# Patient Record
Sex: Female | Born: 1940 | Race: White | Hispanic: No | Smoking: Former smoker
Health system: Southern US, Community
[De-identification: ages and names within clinical notes are randomized; demographics above are authoritative.]

## PROBLEM LIST (undated history)

## (undated) DIAGNOSIS — Z85828 Personal history of other malignant neoplasm of skin: Secondary | ICD-10-CM

## (undated) DIAGNOSIS — N183 Chronic kidney disease, stage 3 unspecified: Secondary | ICD-10-CM

## (undated) DIAGNOSIS — M1990 Unspecified osteoarthritis, unspecified site: Secondary | ICD-10-CM

## (undated) DIAGNOSIS — R7303 Prediabetes: Secondary | ICD-10-CM

## (undated) DIAGNOSIS — T4145XA Adverse effect of unspecified anesthetic, initial encounter: Secondary | ICD-10-CM

## (undated) DIAGNOSIS — J45909 Unspecified asthma, uncomplicated: Secondary | ICD-10-CM

## (undated) DIAGNOSIS — K219 Gastro-esophageal reflux disease without esophagitis: Secondary | ICD-10-CM

## (undated) DIAGNOSIS — I77810 Thoracic aortic ectasia: Secondary | ICD-10-CM

## (undated) DIAGNOSIS — Z8719 Personal history of other diseases of the digestive system: Secondary | ICD-10-CM

## (undated) DIAGNOSIS — E7800 Pure hypercholesterolemia, unspecified: Secondary | ICD-10-CM

## (undated) DIAGNOSIS — C801 Malignant (primary) neoplasm, unspecified: Secondary | ICD-10-CM

## (undated) DIAGNOSIS — R51 Headache: Secondary | ICD-10-CM

## (undated) DIAGNOSIS — I10 Essential (primary) hypertension: Secondary | ICD-10-CM

## (undated) DIAGNOSIS — T8859XA Other complications of anesthesia, initial encounter: Secondary | ICD-10-CM

## (undated) DIAGNOSIS — M8580 Other specified disorders of bone density and structure, unspecified site: Secondary | ICD-10-CM

## (undated) HISTORY — DX: Personal history of other malignant neoplasm of skin: Z85.828

## (undated) HISTORY — DX: Other specified disorders of bone density and structure, unspecified site: M85.80

## (undated) HISTORY — PX: COLONOSCOPY: SHX174

## (undated) HISTORY — PX: EYE SURGERY: SHX253

## (undated) HISTORY — PX: APPENDECTOMY: SHX54

## (undated) HISTORY — PX: CHOLECYSTECTOMY: SHX55

## (undated) HISTORY — DX: Chronic kidney disease, stage 3 unspecified: N18.30

## (undated) HISTORY — DX: Gastro-esophageal reflux disease without esophagitis: K21.9

## (undated) HISTORY — DX: Prediabetes: R73.03

## (undated) HISTORY — DX: Thoracic aortic ectasia: I77.810

## (undated) HISTORY — PX: ABDOMINAL HYSTERECTOMY: SHX81

---

## 1999-02-16 ENCOUNTER — Encounter: Payer: Self-pay | Admitting: Dermatology

## 1999-02-16 ENCOUNTER — Encounter: Admission: RE | Admit: 1999-02-16 | Discharge: 1999-02-16 | Payer: Self-pay | Admitting: Dermatology

## 1999-11-08 ENCOUNTER — Encounter: Payer: Self-pay | Admitting: Family Medicine

## 1999-11-08 ENCOUNTER — Encounter: Admission: RE | Admit: 1999-11-08 | Discharge: 1999-11-08 | Payer: Self-pay | Admitting: Family Medicine

## 2000-11-08 ENCOUNTER — Encounter: Payer: Self-pay | Admitting: Family Medicine

## 2000-11-08 ENCOUNTER — Encounter: Admission: RE | Admit: 2000-11-08 | Discharge: 2000-11-08 | Payer: Self-pay | Admitting: Family Medicine

## 2000-11-24 ENCOUNTER — Inpatient Hospital Stay (HOSPITAL_COMMUNITY): Admission: EM | Admit: 2000-11-24 | Discharge: 2000-11-26 | Payer: Self-pay | Admitting: Emergency Medicine

## 2000-11-24 ENCOUNTER — Encounter: Payer: Self-pay | Admitting: Emergency Medicine

## 2000-11-26 ENCOUNTER — Encounter: Payer: Self-pay | Admitting: Internal Medicine

## 2000-12-11 ENCOUNTER — Encounter: Payer: Self-pay | Admitting: Family Medicine

## 2000-12-11 ENCOUNTER — Encounter: Admission: RE | Admit: 2000-12-11 | Discharge: 2000-12-11 | Payer: Self-pay | Admitting: Family Medicine

## 2000-12-17 ENCOUNTER — Encounter: Admission: RE | Admit: 2000-12-17 | Discharge: 2000-12-17 | Payer: Self-pay | Admitting: Family Medicine

## 2000-12-17 ENCOUNTER — Encounter: Payer: Self-pay | Admitting: Family Medicine

## 2000-12-31 ENCOUNTER — Encounter: Admission: RE | Admit: 2000-12-31 | Discharge: 2000-12-31 | Payer: Self-pay | Admitting: Gastroenterology

## 2000-12-31 ENCOUNTER — Encounter: Payer: Self-pay | Admitting: Gastroenterology

## 2001-01-07 ENCOUNTER — Encounter: Payer: Self-pay | Admitting: Gastroenterology

## 2001-01-07 ENCOUNTER — Encounter: Admission: RE | Admit: 2001-01-07 | Discharge: 2001-01-07 | Payer: Self-pay | Admitting: Gastroenterology

## 2001-01-24 ENCOUNTER — Encounter (INDEPENDENT_AMBULATORY_CARE_PROVIDER_SITE_OTHER): Payer: Self-pay | Admitting: Specialist

## 2001-01-24 ENCOUNTER — Encounter: Payer: Self-pay | Admitting: General Surgery

## 2001-01-25 ENCOUNTER — Inpatient Hospital Stay (HOSPITAL_COMMUNITY): Admission: RE | Admit: 2001-01-25 | Discharge: 2001-01-26 | Payer: Self-pay | Admitting: General Surgery

## 2001-11-14 ENCOUNTER — Encounter: Admission: RE | Admit: 2001-11-14 | Discharge: 2001-11-14 | Payer: Self-pay | Admitting: Family Medicine

## 2001-11-14 ENCOUNTER — Encounter: Payer: Self-pay | Admitting: Family Medicine

## 2002-11-19 ENCOUNTER — Encounter: Payer: Self-pay | Admitting: Family Medicine

## 2002-11-19 ENCOUNTER — Encounter: Admission: RE | Admit: 2002-11-19 | Discharge: 2002-11-19 | Payer: Self-pay | Admitting: Family Medicine

## 2003-11-24 ENCOUNTER — Encounter: Admission: RE | Admit: 2003-11-24 | Discharge: 2003-11-24 | Payer: Self-pay | Admitting: Obstetrics and Gynecology

## 2004-12-13 ENCOUNTER — Encounter: Admission: RE | Admit: 2004-12-13 | Discharge: 2004-12-13 | Payer: Self-pay | Admitting: Family Medicine

## 2005-12-14 ENCOUNTER — Encounter: Admission: RE | Admit: 2005-12-14 | Discharge: 2005-12-14 | Payer: Self-pay | Admitting: Family Medicine

## 2006-12-17 ENCOUNTER — Encounter: Admission: RE | Admit: 2006-12-17 | Discharge: 2006-12-17 | Payer: Self-pay | Admitting: Family Medicine

## 2007-12-18 ENCOUNTER — Encounter: Admission: RE | Admit: 2007-12-18 | Discharge: 2007-12-18 | Payer: Self-pay | Admitting: Family Medicine

## 2008-12-18 ENCOUNTER — Encounter: Admission: RE | Admit: 2008-12-18 | Discharge: 2008-12-18 | Payer: Self-pay | Admitting: Family Medicine

## 2009-12-21 ENCOUNTER — Encounter: Admission: RE | Admit: 2009-12-21 | Discharge: 2009-12-21 | Payer: Self-pay | Admitting: Family Medicine

## 2010-09-09 NOTE — Op Note (Signed)
[facility]  Patient:    Connie Weber, Connie Weber Visit Number: [id number] MRN: [id number]          Service Type: SUR Location: [id number] Attending Physician:  Arlis Porta Proc. Date: 01/24/01 Admit Date:  01/24/2001   CC:         Blair Heys, M.D.  James L. Randa Evens, M.D.   Operative Report  PREOPERATIVE DIAGNOSIS:  Symptomatic cholecystitis.  POSTOPERATIVE DIAGNOSIS:  Chronic calculus cholecystitis.  PROCEDURE:  Laparoscopic cholecystectomy with intraoperative cholangiogram.  SURGEON:  Adolph Pollack, M.D.  ASSISTANT:  Sheppard Plumber. Earlene Plater, M.D.  ANESTHESIA:  General.  INDICATIONS:  Ms. Robart is a 70 year old female, who had an episode of severe epigastric and substernal pressure type pain that radiated around to the back. She was nauseated and felt sweaty.  She went to the hospital, and she was ruled out for myocardial infarction.  She had a gallbladder ultrasound which was read as normal.  She saw Dr. Randa Evens, and a CT scan suggested that she had gallstones.  An oral cholecystogram was performed to confirm the fact that she had multiple gallstones.  Her history is consistent with biliary colic, and now she is brought to the operating room for elective cholecystectomy.  The procedure and risks were explained to her preoperatively.  TECHNIQUE:  She was placed supine on the operating table, and a general anesthetic was administered.  Her abdomen was sterilely prepped and draped. Local anesthetic was infiltrated in the subumbilical region, and a small longitudinal subumbilical ______  was made incising the skin and then dividing the subcutaneous tissue sharply.  The fascia was identified, and a 1-1.5 cm incision made in the midline fascia.  A pursestring suture of 0 Vicryl was placed around the fascial edges.  The peritoneal cavity was then entered bluntly and under direct vision.  A Hasson trocar was introduced into the peritoneal  cavity, and a pneumoperitoneum was created by insufflation of C02 gas.  Next, the laparoscope was introduced.  The patient was placed in the reverse Trendelenburg position with her right side tipped upward.  The liver appeared normal on the surface as did the stomach.  The omentum was draped down over the lower abdominal viscera, so I did not get to visualize it.  Under direct vision, an 11 mm trocar was placed through an epigastric incision and two 5 mm trocars placed through right abdominal incisions.  The fundus of the gallbladder was grasped and omental adhesions and adhesions to the duodenum and infundibulum were taken down sharply and bluntly.  The infundibulum was then grasped and then using blunt dissection, with no cautery, the infundibulum was completely mobilized, creating a window through which we identified the cystic duct and the cystic artery.  A clip was placed through the cystic duct gallbladder junction, and a small incision was made in the cystic duct.  A cholangiocatheter was passed through the anterior abdominal wall, into the cystic duct, and a cholangiogram was performed.  Under real-time fluoroscopy, dilute contrast material was injected into the cystic duct.  It promptly went into the common hepatic, and right and left hepatic ducts were seen.  It then travelled down the common bile duct and splashed into the duodenum.  There were no obvious obstructing lesions.  The final report is pending the radiologists interpretation.  I then removed the cholangiocatheter and milked the cystic duct and noticed three small stone fragments came out of the cystic duct.  No further fragments came out with  milking the cystic duct.  I clipped the cystic duct three times on the staying-in side and divided it.  The cystic artery was then clipped and divided.  The gallbladder was dissected free from the liver bed with the cautery.  A small perforation was placed in the gallbladder  with leakage of some bilious fluid but no stones.  Once the gallbladder was removed, it was placed in an Endopouch bag.  I then copiously irrigated the perihepatic area and examined the gallbladder fossa, and it was hemostatic.  I continued to irrigate until the fluid returning became clear.  I then removed the gallbladder through the subumbilical port and under direct vision, closed the subumbilical fascial defect by tightening up and tying down the pursestring suture. pursestring suture.  The rest of the ports were removed and pneumoperitoneum was released.  Her skin incisions were then closed with 4-0 Monocryl subcuticular stitches followed by Steri-Strips and sterile dressings.  She tolerated the procedure well without any apparent complications and was taken to the recovery room in satisfactory condition. Attending Physician:  Arlis Porta DD:  01/24/01 TD:  01/24/01 Job: [id number]

## 2010-09-09 NOTE — H&P (Signed)
[place]  Patient:    Connie Weber, Connie Weber                         MRN: [id number] Adm. Date:  11/24/00 Attending:  Desma Maxim, M.D.                         History and Physical  CHIEF COMPLAINT:  Chest pain.  HISTORY OF PRESENT ILLNESS:  The patient is a 70 year old female patient of Dr. Manus Gunning with known diagnoses of hypertension, allergy, and hiatal hernia who presents with a history of waking up this morning around 3 a.m. with a tightness across her chest.  It felt like she needed to take a deep breath and she would feel fine or stretch out her chest.  She thought it might be her hernia.  She took a Tums at that point.  Symptoms increased over the ensuing hour.  She took a second Tums.  No relieve.  She felt hot, sweatiness across the body, but no palpitation or shortness of breath other than again felt like she needed to take a deep breath.  She did have some nausea but no vomiting or diarrhea.  She has had no dyspeptic symptoms.  Symptoms persisted and at 5:30 took two aspirin, which she says helped her symptoms; however, because of the intensity, how bad she felt, and nervousness came to the emergency room at about 6 oclock, at which time she was pain free and has remained such since that time.  She is known to have a hiatal hernia and last night did have some sauerkraut and some Vienna sausage, which she normally tries to avoid, so I do not know if that had anything to play with this or not.  She had been fine otherwise with just mild flare of her allergies but no fever, chills, cough, cold, or shortness of breath.  No urinary symptoms.  In the emergency room, again she was asymptomatic.  Labs were unremarkable.  Enzymes negative.  EKG negative but, given history and risk factors of hypertension and hyperlipidemia, is admitted to rule out.  PAST MEDICAL HISTORY:  Hypertension, allergy, and hiatal hernia.  MEDICATIONS: 1. Premarin  0.625. 2. Ventolin p.r.n. 3. Aspirin q.d. 4. Norvasc 5. 5. Diovan 80. 6. Allegra 60 b.i.d. p.r.n.  FAMILY HISTORY:  Heart disease in her maternal grandmother when she was older. Hypertension fairly prominent on both sides of the family.  PAST SURGICAL HISTORY:  Hysterectomy, noncancer.  Appendectomy and repair of right rotator cuff.  She had a recent mammogram in July, just a month ago, and it was normal.  She did have labs in May.  Cholesterol 273, LDL 191 and was supposed to work on diet but I doubt that she will be able to attain the 30+ percent reduction that she needs.  REVIEW OF SYSTEMS:  Otherwise is negative except as related to above.  PHYSICAL EXAMINATION:  VITAL SIGNS:  135/70 and regular, pulse 74 and regular, respirations 16 and unlabored, afebrile.  EKG unremarkable.  HEENT:  Wears glasses.  TMs and canals clear.  PERRL.  EOMs full.  Disks flat. Arteriolar narrowing.  Nasal cavity/oral cavity clear.  NECK:  Supple.  No adenopathy, JVD, bruits, or enlarged thyroid.  CHEST:  Clear, nontender.  RR without MGCR.  ABDOMEN:  Normal BS.  Soft, nontender.  No masses or organomegaly.  Rectal was not done, as pelvic was  not done.  EXTREMITIES:  No acute joint abnormality.  No edema.  Pulses intact.  NEUROLOGIC:  Intact.  LABORATORY DATA:  EKG normal.  No acute change.  No abnormalities other than slightly elevated white count at 11.9 with just a slight shift.  ASSESSMENT: 1. Chest pain, rule out cardiac, possible gastrointestinal. 2. High blood pressure. 3. Hyperlipidemia. 4. Allergy.  PLAN:  Serial enzymes and telemetry monitoring.  Institute hyperlipidemic therapy.  Probable outpatient ETT. DD:  11/24/00 TD:  11/25/00 Job: [id number]

## 2010-11-22 ENCOUNTER — Other Ambulatory Visit: Payer: Self-pay | Admitting: Family Medicine

## 2010-11-22 DIAGNOSIS — Z1231 Encounter for screening mammogram for malignant neoplasm of breast: Secondary | ICD-10-CM

## 2010-12-29 ENCOUNTER — Ambulatory Visit
Admission: RE | Admit: 2010-12-29 | Discharge: 2010-12-29 | Disposition: A | Discharge status: Home / Self Care | Payer: Medicare Other | Source: Ambulatory Visit | Attending: Family Medicine | Admitting: Family Medicine

## 2010-12-29 DIAGNOSIS — Z1231 Encounter for screening mammogram for malignant neoplasm of breast: Secondary | ICD-10-CM

## 2011-01-02 ENCOUNTER — Other Ambulatory Visit: Payer: Self-pay | Admitting: Family Medicine

## 2011-01-02 DIAGNOSIS — R928 Other abnormal and inconclusive findings on diagnostic imaging of breast: Secondary | ICD-10-CM

## 2011-01-04 ENCOUNTER — Ambulatory Visit
Admission: RE | Admit: 2011-01-04 | Discharge: 2011-01-04 | Disposition: A | Discharge status: Home / Self Care | Payer: Medicare Other | Source: Ambulatory Visit | Attending: Family Medicine | Admitting: Family Medicine

## 2011-01-04 DIAGNOSIS — R928 Other abnormal and inconclusive findings on diagnostic imaging of breast: Secondary | ICD-10-CM

## 2011-04-25 HISTORY — PX: OTHER SURGICAL HISTORY: SHX169

## 2011-04-27 DIAGNOSIS — M75120 Complete rotator cuff tear or rupture of unspecified shoulder, not specified as traumatic: Secondary | ICD-10-CM | POA: Diagnosis not present

## 2011-04-27 DIAGNOSIS — M25519 Pain in unspecified shoulder: Secondary | ICD-10-CM | POA: Diagnosis not present

## 2011-04-27 DIAGNOSIS — M19019 Primary osteoarthritis, unspecified shoulder: Secondary | ICD-10-CM | POA: Diagnosis not present

## 2011-05-02 DIAGNOSIS — M19019 Primary osteoarthritis, unspecified shoulder: Secondary | ICD-10-CM | POA: Diagnosis not present

## 2011-05-02 DIAGNOSIS — M25519 Pain in unspecified shoulder: Secondary | ICD-10-CM | POA: Diagnosis not present

## 2011-05-02 DIAGNOSIS — M75120 Complete rotator cuff tear or rupture of unspecified shoulder, not specified as traumatic: Secondary | ICD-10-CM | POA: Diagnosis not present

## 2011-05-04 DIAGNOSIS — M19019 Primary osteoarthritis, unspecified shoulder: Secondary | ICD-10-CM | POA: Diagnosis not present

## 2011-05-04 DIAGNOSIS — M25519 Pain in unspecified shoulder: Secondary | ICD-10-CM | POA: Diagnosis not present

## 2011-05-04 DIAGNOSIS — M75120 Complete rotator cuff tear or rupture of unspecified shoulder, not specified as traumatic: Secondary | ICD-10-CM | POA: Diagnosis not present

## 2011-05-09 DIAGNOSIS — M25519 Pain in unspecified shoulder: Secondary | ICD-10-CM | POA: Diagnosis not present

## 2011-05-09 DIAGNOSIS — M19019 Primary osteoarthritis, unspecified shoulder: Secondary | ICD-10-CM | POA: Diagnosis not present

## 2011-05-09 DIAGNOSIS — M75120 Complete rotator cuff tear or rupture of unspecified shoulder, not specified as traumatic: Secondary | ICD-10-CM | POA: Diagnosis not present

## 2011-05-11 DIAGNOSIS — M25519 Pain in unspecified shoulder: Secondary | ICD-10-CM | POA: Diagnosis not present

## 2011-05-11 DIAGNOSIS — M75120 Complete rotator cuff tear or rupture of unspecified shoulder, not specified as traumatic: Secondary | ICD-10-CM | POA: Diagnosis not present

## 2011-05-11 DIAGNOSIS — M19019 Primary osteoarthritis, unspecified shoulder: Secondary | ICD-10-CM | POA: Diagnosis not present

## 2011-05-16 DIAGNOSIS — M25519 Pain in unspecified shoulder: Secondary | ICD-10-CM | POA: Diagnosis not present

## 2011-05-16 DIAGNOSIS — M899 Disorder of bone, unspecified: Secondary | ICD-10-CM | POA: Diagnosis not present

## 2011-05-16 DIAGNOSIS — M949 Disorder of cartilage, unspecified: Secondary | ICD-10-CM | POA: Diagnosis not present

## 2011-05-16 DIAGNOSIS — M19019 Primary osteoarthritis, unspecified shoulder: Secondary | ICD-10-CM | POA: Diagnosis not present

## 2011-05-16 DIAGNOSIS — M75120 Complete rotator cuff tear or rupture of unspecified shoulder, not specified as traumatic: Secondary | ICD-10-CM | POA: Diagnosis not present

## 2011-05-18 DIAGNOSIS — M75120 Complete rotator cuff tear or rupture of unspecified shoulder, not specified as traumatic: Secondary | ICD-10-CM | POA: Diagnosis not present

## 2011-05-18 DIAGNOSIS — M25519 Pain in unspecified shoulder: Secondary | ICD-10-CM | POA: Diagnosis not present

## 2011-05-18 DIAGNOSIS — M19019 Primary osteoarthritis, unspecified shoulder: Secondary | ICD-10-CM | POA: Diagnosis not present

## 2011-05-23 DIAGNOSIS — M25519 Pain in unspecified shoulder: Secondary | ICD-10-CM | POA: Diagnosis not present

## 2011-05-23 DIAGNOSIS — M19019 Primary osteoarthritis, unspecified shoulder: Secondary | ICD-10-CM | POA: Diagnosis not present

## 2011-05-25 DIAGNOSIS — M75120 Complete rotator cuff tear or rupture of unspecified shoulder, not specified as traumatic: Secondary | ICD-10-CM | POA: Diagnosis not present

## 2011-05-25 DIAGNOSIS — M19019 Primary osteoarthritis, unspecified shoulder: Secondary | ICD-10-CM | POA: Diagnosis not present

## 2011-05-25 DIAGNOSIS — M25519 Pain in unspecified shoulder: Secondary | ICD-10-CM | POA: Diagnosis not present

## 2011-05-30 DIAGNOSIS — M25519 Pain in unspecified shoulder: Secondary | ICD-10-CM | POA: Diagnosis not present

## 2011-05-30 DIAGNOSIS — M75120 Complete rotator cuff tear or rupture of unspecified shoulder, not specified as traumatic: Secondary | ICD-10-CM | POA: Diagnosis not present

## 2011-05-30 DIAGNOSIS — M19019 Primary osteoarthritis, unspecified shoulder: Secondary | ICD-10-CM | POA: Diagnosis not present

## 2011-06-01 DIAGNOSIS — M25519 Pain in unspecified shoulder: Secondary | ICD-10-CM | POA: Diagnosis not present

## 2011-06-01 DIAGNOSIS — M75120 Complete rotator cuff tear or rupture of unspecified shoulder, not specified as traumatic: Secondary | ICD-10-CM | POA: Diagnosis not present

## 2011-06-01 DIAGNOSIS — M19019 Primary osteoarthritis, unspecified shoulder: Secondary | ICD-10-CM | POA: Diagnosis not present

## 2011-06-06 DIAGNOSIS — M75120 Complete rotator cuff tear or rupture of unspecified shoulder, not specified as traumatic: Secondary | ICD-10-CM | POA: Diagnosis not present

## 2011-06-06 DIAGNOSIS — M19019 Primary osteoarthritis, unspecified shoulder: Secondary | ICD-10-CM | POA: Diagnosis not present

## 2011-06-06 DIAGNOSIS — M25519 Pain in unspecified shoulder: Secondary | ICD-10-CM | POA: Diagnosis not present

## 2011-06-20 DIAGNOSIS — M19019 Primary osteoarthritis, unspecified shoulder: Secondary | ICD-10-CM | POA: Diagnosis not present

## 2011-06-20 DIAGNOSIS — M25519 Pain in unspecified shoulder: Secondary | ICD-10-CM | POA: Diagnosis not present

## 2011-06-20 DIAGNOSIS — M75120 Complete rotator cuff tear or rupture of unspecified shoulder, not specified as traumatic: Secondary | ICD-10-CM | POA: Diagnosis not present

## 2011-06-22 DIAGNOSIS — M19019 Primary osteoarthritis, unspecified shoulder: Secondary | ICD-10-CM | POA: Diagnosis not present

## 2011-10-24 DIAGNOSIS — M899 Disorder of bone, unspecified: Secondary | ICD-10-CM | POA: Diagnosis not present

## 2011-10-24 DIAGNOSIS — Z79899 Other long term (current) drug therapy: Secondary | ICD-10-CM | POA: Diagnosis not present

## 2011-10-24 DIAGNOSIS — E7800 Pure hypercholesterolemia, unspecified: Secondary | ICD-10-CM | POA: Diagnosis not present

## 2011-10-24 DIAGNOSIS — M949 Disorder of cartilage, unspecified: Secondary | ICD-10-CM | POA: Diagnosis not present

## 2011-10-24 DIAGNOSIS — M76899 Other specified enthesopathies of unspecified lower limb, excluding foot: Secondary | ICD-10-CM | POA: Diagnosis not present

## 2011-10-24 DIAGNOSIS — I10 Essential (primary) hypertension: Secondary | ICD-10-CM | POA: Diagnosis not present

## 2011-10-24 DIAGNOSIS — J45909 Unspecified asthma, uncomplicated: Secondary | ICD-10-CM | POA: Diagnosis not present

## 2011-10-24 DIAGNOSIS — K219 Gastro-esophageal reflux disease without esophagitis: Secondary | ICD-10-CM | POA: Diagnosis not present

## 2011-11-16 DIAGNOSIS — Z961 Presence of intraocular lens: Secondary | ICD-10-CM | POA: Diagnosis not present

## 2012-01-19 ENCOUNTER — Other Ambulatory Visit: Payer: Self-pay | Admitting: Family Medicine

## 2012-01-19 DIAGNOSIS — Z1231 Encounter for screening mammogram for malignant neoplasm of breast: Secondary | ICD-10-CM

## 2012-02-09 ENCOUNTER — Ambulatory Visit
Admission: RE | Admit: 2012-02-09 | Discharge: 2012-02-09 | Disposition: A | Discharge status: Home / Self Care | Payer: Medicare Other | Source: Ambulatory Visit | Attending: Family Medicine | Admitting: Family Medicine

## 2012-02-09 DIAGNOSIS — Z1231 Encounter for screening mammogram for malignant neoplasm of breast: Secondary | ICD-10-CM

## 2012-02-26 DIAGNOSIS — Z23 Encounter for immunization: Secondary | ICD-10-CM | POA: Diagnosis not present

## 2012-04-26 DIAGNOSIS — Z79899 Other long term (current) drug therapy: Secondary | ICD-10-CM | POA: Diagnosis not present

## 2012-04-26 DIAGNOSIS — K219 Gastro-esophageal reflux disease without esophagitis: Secondary | ICD-10-CM | POA: Diagnosis not present

## 2012-04-26 DIAGNOSIS — B351 Tinea unguium: Secondary | ICD-10-CM | POA: Diagnosis not present

## 2012-04-26 DIAGNOSIS — I10 Essential (primary) hypertension: Secondary | ICD-10-CM | POA: Diagnosis not present

## 2012-04-26 DIAGNOSIS — Z1331 Encounter for screening for depression: Secondary | ICD-10-CM | POA: Diagnosis not present

## 2012-04-26 DIAGNOSIS — M899 Disorder of bone, unspecified: Secondary | ICD-10-CM | POA: Diagnosis not present

## 2012-04-26 DIAGNOSIS — E7800 Pure hypercholesterolemia, unspecified: Secondary | ICD-10-CM | POA: Diagnosis not present

## 2012-09-18 DIAGNOSIS — R5381 Other malaise: Secondary | ICD-10-CM | POA: Diagnosis not present

## 2012-09-18 DIAGNOSIS — Z79899 Other long term (current) drug therapy: Secondary | ICD-10-CM | POA: Diagnosis not present

## 2012-09-18 DIAGNOSIS — M25559 Pain in unspecified hip: Secondary | ICD-10-CM | POA: Diagnosis not present

## 2012-09-18 DIAGNOSIS — E7800 Pure hypercholesterolemia, unspecified: Secondary | ICD-10-CM | POA: Diagnosis not present

## 2012-09-18 DIAGNOSIS — IMO0001 Reserved for inherently not codable concepts without codable children: Secondary | ICD-10-CM | POA: Diagnosis not present

## 2012-09-18 DIAGNOSIS — R5383 Other fatigue: Secondary | ICD-10-CM | POA: Diagnosis not present

## 2012-10-24 DIAGNOSIS — Z79899 Other long term (current) drug therapy: Secondary | ICD-10-CM | POA: Diagnosis not present

## 2012-10-24 DIAGNOSIS — M2550 Pain in unspecified joint: Secondary | ICD-10-CM | POA: Diagnosis not present

## 2012-10-24 DIAGNOSIS — E7800 Pure hypercholesterolemia, unspecified: Secondary | ICD-10-CM | POA: Diagnosis not present

## 2012-10-24 DIAGNOSIS — M25559 Pain in unspecified hip: Secondary | ICD-10-CM | POA: Diagnosis not present

## 2012-10-24 DIAGNOSIS — M949 Disorder of cartilage, unspecified: Secondary | ICD-10-CM | POA: Diagnosis not present

## 2012-10-24 DIAGNOSIS — M79609 Pain in unspecified limb: Secondary | ICD-10-CM | POA: Diagnosis not present

## 2012-10-24 DIAGNOSIS — I10 Essential (primary) hypertension: Secondary | ICD-10-CM | POA: Diagnosis not present

## 2012-10-24 DIAGNOSIS — K219 Gastro-esophageal reflux disease without esophagitis: Secondary | ICD-10-CM | POA: Diagnosis not present

## 2012-10-24 DIAGNOSIS — M899 Disorder of bone, unspecified: Secondary | ICD-10-CM | POA: Diagnosis not present

## 2012-12-18 ENCOUNTER — Encounter (HOSPITAL_COMMUNITY): Payer: Self-pay | Admitting: *Deleted

## 2012-12-18 ENCOUNTER — Emergency Department (HOSPITAL_COMMUNITY): Payer: Medicare Other

## 2012-12-18 ENCOUNTER — Emergency Department (HOSPITAL_COMMUNITY)
Admission: EM | Admit: 2012-12-18 | Discharge: 2012-12-18 | Disposition: A | Discharge status: Home / Self Care | Payer: Medicare Other | Attending: Emergency Medicine | Admitting: Emergency Medicine

## 2012-12-18 DIAGNOSIS — Y9389 Activity, other specified: Secondary | ICD-10-CM | POA: Insufficient documentation

## 2012-12-18 DIAGNOSIS — S82843A Displaced bimalleolar fracture of unspecified lower leg, initial encounter for closed fracture: Secondary | ICD-10-CM | POA: Diagnosis not present

## 2012-12-18 DIAGNOSIS — S8253XA Displaced fracture of medial malleolus of unspecified tibia, initial encounter for closed fracture: Secondary | ICD-10-CM | POA: Diagnosis not present

## 2012-12-18 DIAGNOSIS — Z7982 Long term (current) use of aspirin: Secondary | ICD-10-CM | POA: Insufficient documentation

## 2012-12-18 DIAGNOSIS — Y929 Unspecified place or not applicable: Secondary | ICD-10-CM | POA: Insufficient documentation

## 2012-12-18 DIAGNOSIS — E7800 Pure hypercholesterolemia, unspecified: Secondary | ICD-10-CM | POA: Diagnosis not present

## 2012-12-18 DIAGNOSIS — Z79899 Other long term (current) drug therapy: Secondary | ICD-10-CM | POA: Diagnosis not present

## 2012-12-18 DIAGNOSIS — I10 Essential (primary) hypertension: Secondary | ICD-10-CM | POA: Insufficient documentation

## 2012-12-18 DIAGNOSIS — X500XXA Overexertion from strenuous movement or load, initial encounter: Secondary | ICD-10-CM | POA: Insufficient documentation

## 2012-12-18 DIAGNOSIS — S82841A Displaced bimalleolar fracture of right lower leg, initial encounter for closed fracture: Secondary | ICD-10-CM

## 2012-12-18 DIAGNOSIS — S99929A Unspecified injury of unspecified foot, initial encounter: Secondary | ICD-10-CM | POA: Diagnosis not present

## 2012-12-18 DIAGNOSIS — M79609 Pain in unspecified limb: Secondary | ICD-10-CM | POA: Diagnosis not present

## 2012-12-18 DIAGNOSIS — S8990XA Unspecified injury of unspecified lower leg, initial encounter: Secondary | ICD-10-CM | POA: Diagnosis not present

## 2012-12-18 HISTORY — DX: Essential (primary) hypertension: I10

## 2012-12-18 HISTORY — DX: Pure hypercholesterolemia, unspecified: E78.00

## 2012-12-18 MED ORDER — ONDANSETRON HCL 4 MG/2ML IJ SOLN
4.0000 mg | Freq: Once | INTRAMUSCULAR | Status: AC
  Administered 2012-12-18: 4 mg via INTRAVENOUS
  Filled 2012-12-18: qty 2

## 2012-12-18 MED ORDER — OXYCODONE-ACETAMINOPHEN 5-325 MG PO TABS
1.0000 | ORAL_TABLET | Freq: Once | ORAL | Status: AC
  Administered 2012-12-18: 1 via ORAL
  Filled 2012-12-18: qty 1

## 2012-12-18 MED ORDER — OXYCODONE-ACETAMINOPHEN 5-325 MG PO TABS: 1.0000 | ORAL_TABLET | Freq: Four times a day (QID) | ORAL | Status: DC | PRN

## 2012-12-18 MED ORDER — OXYCODONE-ACETAMINOPHEN 5-325 MG PO TABS
2.0000 | ORAL_TABLET | Freq: Once | ORAL | Status: AC
  Administered 2012-12-18: 2 via ORAL
  Filled 2012-12-18: qty 2

## 2012-12-18 MED ORDER — MORPHINE SULFATE 4 MG/ML IJ SOLN
4.0000 mg | Freq: Once | INTRAMUSCULAR | Status: AC
  Administered 2012-12-18: 4 mg via INTRAVENOUS
  Filled 2012-12-18: qty 1

## 2012-12-18 NOTE — ED Notes (Signed)
Ortho at bedside.

## 2012-12-18 NOTE — ED Notes (Signed)
Paged ortho 

## 2012-12-18 NOTE — Progress Notes (Signed)
Orthopedic Tech Progress Note Patient Details:  Connie Weber Sep 01, 1940 [id number]  Ortho Devices Type of Ortho Device: Ace wrap;Post (short leg) splint Ortho Device/Splint Location: RLE Ortho Device/Splint Interventions: Ordered;Application   Jennye Moccasin 12/18/2012, 5:35 PM

## 2012-12-18 NOTE — ED Provider Notes (Signed)
CSN: [id number]     Arrival date & time 12/18/12  1329 History   First MD Initiated Contact with Patient 12/18/12 1510     Chief Complaint  Patient presents with  . Ankle Injury   (Consider location/radiation/quality/duration/timing/severity/associated sxs/prior Treatment) HPI Comments: Patient reports that just prior to arrival she missed a step walking down the stairs and fell to the ground.  She states that she felt a "pop" in her right ankle when she fell.  She denies hitting her head or LOC.  She reports that she is also having pain in her left foot, but denies any other pain. No neck or back pain.  She denies numbness or tingling.  She has not been able to bear weight on the right leg since the injury.  She has not taken anything for pain prior to arrival.  Pain gradually worsening.    The history is provided by the patient.    Past Medical History  Diagnosis Date  . Hypertension   . Hypercholesteremia    Past Surgical History  Procedure Laterality Date  . Cholecystectomy    . Abdominal hysterectomy    . Appendectomy    . Eye surgery     History reviewed. No pertinent family history. History  Substance Use Topics  . Smoking status: Never Smoker   . Smokeless tobacco: Not on file  . Alcohol Use: No   OB History   Grav Para Term Preterm Abortions TAB SAB Ect Mult Living                 Review of Systems  Musculoskeletal:       Right ankle pain Left foot pain  All other systems reviewed and are negative.    Allergies  Prednisone  Home Medications   Current Outpatient Rx  Name  Route  Sig  Dispense  Refill  . aspirin 325 MG tablet   Oral   Take 325 mg by mouth daily.         Marland Kitchen losartan-hydrochlorothiazide (HYZAAR) 100-12.5 MG per tablet   Oral   Take 1 tablet by mouth daily.         Marland Kitchen lovastatin (MEVACOR) 10 MG tablet   Oral   Take 10 mg by mouth daily.          BP 140/104  Pulse 104  Temp(Src) 97.8 F (36.6 C) (Oral)  Resp 16  SpO2  96% Physical Exam  Nursing note and vitals reviewed. Constitutional: She appears well-developed and well-nourished.  HENT:  Head: Normocephalic and atraumatic.  Neck: Normal range of motion. Neck supple.  Cardiovascular: Normal rate, regular rhythm and normal heart sounds.   Pulses:      Dorsalis pedis pulses are 2+ on the right side, and 2+ on the left side.  Pulmonary/Chest: Effort normal and breath sounds normal.  Musculoskeletal:       Right hip: She exhibits normal range of motion, normal strength and no bony tenderness.       Left hip: She exhibits normal range of motion, no tenderness and no swelling.       Right knee: She exhibits normal range of motion and no swelling. No tenderness found.       Left knee: She exhibits normal range of motion and no deformity. No tenderness found.       Right ankle: She exhibits decreased range of motion and swelling. She exhibits no laceration and normal pulse. Tenderness. Lateral malleolus and medial malleolus tenderness found.  Left ankle: She exhibits normal range of motion, no swelling and no deformity. No tenderness.       Cervical back: She exhibits normal range of motion, no tenderness, no bony tenderness, no swelling, no edema and no deformity.       Thoracic back: She exhibits normal range of motion, no tenderness, no bony tenderness, no swelling, no edema and no deformity.       Lumbar back: She exhibits normal range of motion, no tenderness, no bony tenderness, no swelling, no edema and no deformity.       Left foot: She exhibits tenderness, bony tenderness and swelling. She exhibits normal range of motion, normal capillary refill, no deformity and no laceration.  Neurological: She is alert. No sensory deficit.  Skin: Skin is warm, dry and intact.  Psychiatric: She has a normal mood and affect.    ED Course  Procedures (including critical care time) Labs Review Labs Reviewed - No data to display Imaging Review Dg Ankle  Complete Right  12/18/2012   *RADIOLOGY REPORT*  Clinical Data: Fall, ankle pain.  RIGHT ANKLE - COMPLETE 3+ VIEW  Comparison: None.  Findings: The patient has a fracture of the distal fibula.  The most cephalad component of the fracture is through the diaphysis approximately 8 cm above the tip of the distal fibula.  A component of the fracture is also visualized proximally 2 cm above the tip of the fibula. The superior most component of the fracture demonstrates 0.3 cm lateral displacement.  The remainder of the fracture appears nondisplaced.  There is also a fracture of the medial malleolus with 0.3 cm of distraction and 0.8 cm medial displacement.  No other fracture is identified.  IMPRESSION: Distal fibular and medial malleolar fractures as described.   Original Report Authenticated By: Holley Dexter, M.D.   Dg Foot Complete Left  12/18/2012   *RADIOLOGY REPORT*  Clinical Data: Fall with pain and injury to the left foot.  LEFT FOOT - COMPLETE 3+ VIEW  Comparison: None.  Findings: No acute fracture or dislocation is identified.  There is a moderate hallux valgus deformity.  No bony lesions or erosions are identified.  Soft tissues are unremarkable.  IMPRESSION: No acute fracture identified.   Original Report Authenticated By: Irish Lack, M.D.   4:31 PM Discussed with Dr. Magnus Ivan with Orthopedics.  He recommends putting the patient in a posterior and stirrup splint, keeping her non weight bearing, and having her follow up in the office in the next two days. MDM  No diagnosis found. Patient presenting with right ankle pain after a mechanical fall.  She did not hit her head.  No LOC.  Xray results as above showing a bimalleolar fracture of the right ankle.  Fracture is closed.  Patient is neurovascularly intact.  Patient discussed with Dr. Magnus Ivan who also evaluated the patient's xray.  He recommends putting her in a splint, keeping her non weight bearing, and having her follow up in the office.   Patient splinted and discharged home with pain medication and given Orthopedic follow up.   She reports that she has a wheel chair at home that she can use.  Patient stable for discharge.  Return precautions given.    Pascal Lux [place], PA-C 12/19/12 2258

## 2012-12-18 NOTE — ED Notes (Signed)
Pt reports falling, injuring her (R) ankle.  Ankle noted to be swollen, bruised.  Pulses present.

## 2012-12-18 NOTE — ED Notes (Signed)
After cleaning the room, black framed rectangular eye glasses were found. Called phone number listed in chart, no answer but left a voicemail notifying the pt the glasses were left behind.

## 2012-12-18 NOTE — ED Notes (Signed)
Ortho returned call, will come to apply splint.

## 2012-12-19 ENCOUNTER — Other Ambulatory Visit (HOSPITAL_COMMUNITY): Payer: Self-pay | Admitting: Orthopaedic Surgery

## 2012-12-19 DIAGNOSIS — S82843A Displaced bimalleolar fracture of unspecified lower leg, initial encounter for closed fracture: Secondary | ICD-10-CM | POA: Diagnosis not present

## 2012-12-20 ENCOUNTER — Encounter (HOSPITAL_COMMUNITY): Payer: Self-pay | Admitting: Pharmacy Technician

## 2012-12-20 DIAGNOSIS — S82843A Displaced bimalleolar fracture of unspecified lower leg, initial encounter for closed fracture: Secondary | ICD-10-CM | POA: Diagnosis not present

## 2012-12-20 NOTE — ED Provider Notes (Signed)
Medical screening examination/treatment/procedure(s) were performed by non-physician practitioner and as supervising physician I was immediately available for consultation/collaboration.  Shon Baton, MD 12/20/12 1452

## 2012-12-23 NOTE — H&P (Signed)
Orthopaedic Surgery History and Physical Examination 12/23/2012  CC: Right ankle injury  HPI: Ms. Connie Weber comes in today for discussion of her ankle fracture that she sustained earlier this week.  She was seen in the ED and evaluated by my partner Dr. Magnus Ivan.  She endorses severe pain with movement of the ankle and mild pain at baseline.  She denies any other complaints. She's been taking percocet for pain.    Past Medical History:  has a past medical history of Hypertension and Hypercholesteremia.  Medications: No current facility-administered medications for this encounter. Current outpatient prescriptions:aspirin 325 MG tablet, Take 325 mg by mouth daily., Disp: , Rfl: ;  Calcium Carbonate-Vitamin D (CALTRATE 600+D) 600-400 MG-UNIT per tablet, Take 1 tablet by mouth 2 (two) times daily., Disp: , Rfl: ;  famotidine (PEPCID) 20 MG tablet, Take 20 mg by mouth daily., Disp: , Rfl: ;  Glucosamine-Chondroitin (GLUCOSAMINE CHONDR COMPLEX PO), Take 1 tablet by mouth daily., Disp: , Rfl:  ibuprofen (ADVIL,MOTRIN) 200 MG tablet, Take 400 mg by mouth every 6 (six) hours as needed for pain., Disp: , Rfl: ;  losartan-hydrochlorothiazide (HYZAAR) 100-12.5 MG per tablet, Take 1 tablet by mouth daily., Disp: , Rfl: ;  lovastatin (MEVACOR) 10 MG tablet, Take 10 mg by mouth daily., Disp: , Rfl: ;  Multiple Vitamin (MULTIVITAMIN WITH MINERALS) TABS tablet, Take 1 tablet by mouth daily., Disp: , Rfl:  niacin 500 MG tablet, Take 500 mg by mouth at bedtime., Disp: , Rfl: ;  omeprazole (PRILOSEC) 20 MG capsule, Take 20 mg by mouth daily., Disp: , Rfl: ;  oxyCODONE-acetaminophen (PERCOCET/ROXICET) 5-325 MG per tablet, Take 1-2 tablets by mouth every 6 (six) hours as needed for pain., Disp: 20 tablet, Rfl: 0;  pseudoephedrine (PSUDATABS) 30 MG tablet, Take 30 mg by mouth every 4 (four) hours as needed for congestion., Disp: , Rfl:   Allergies: Allergies as of 12/19/2012 - Review Complete 12/18/2012  Allergen Reaction  Noted  . Prednisone Other (See Comments) 12/18/2012    Past Surgical History:  has past surgical history that includes Cholecystectomy; Abdominal hysterectomy; Appendectomy; and Eye surgery.  Social History: History  Substance Use Topics  . Smoking status: Never Smoker   . Smokeless tobacco: Not on file  . Alcohol Use: No    Work/Sport/Hobbies: retired  ROS: Review of Systems: All systems reviewed and are negative except that mentioned in HPI  Physical Examination: There were no vitals filed for this visit. Constitutional: Awake, alert.  WN/WD Appearance: healthy, no acute distress, well-groomed Affect: Normal HEENT: EOMI, mucous membranes moist CV: RRR Pulm: breathing comfortably Abd: soft, non-distended RLE: skin intact, well fitting splint, toes wwp, NVI, limited ankle ROM due to pain and swelling  Imaging: I have personally reviewed the following studies: Displaced bimalleolar ankle fracture  Studies: n/a  Assessment: Right ankle fracture  Plan: 1. Reviewed xrays in clinic 2. Discussed the risks, benefits, alternatives to surgery.  Recommend ORIF. 3. Patient understands and wishes to proceed with surgery 4. Surgery planned for 12/25/12, admit postop overnight for pain control and PT 5. Questions encouraged and answered  N. Glee Arvin, MD [facility] 12/23/2012 8:51 PM

## 2012-12-24 ENCOUNTER — Encounter (HOSPITAL_COMMUNITY): Payer: Self-pay

## 2012-12-24 ENCOUNTER — Other Ambulatory Visit (HOSPITAL_COMMUNITY): Payer: Self-pay | Admitting: *Deleted

## 2012-12-24 ENCOUNTER — Encounter (HOSPITAL_COMMUNITY)
Admission: RE | Admit: 2012-12-24 | Discharge: 2012-12-24 | Disposition: A | Discharge status: Home / Self Care | Payer: Medicare Other | Source: Ambulatory Visit | Attending: Orthopaedic Surgery | Admitting: Orthopaedic Surgery

## 2012-12-24 DIAGNOSIS — K219 Gastro-esophageal reflux disease without esophagitis: Secondary | ICD-10-CM | POA: Diagnosis not present

## 2012-12-24 DIAGNOSIS — I10 Essential (primary) hypertension: Secondary | ICD-10-CM | POA: Diagnosis not present

## 2012-12-24 DIAGNOSIS — M6281 Muscle weakness (generalized): Secondary | ICD-10-CM | POA: Diagnosis not present

## 2012-12-24 DIAGNOSIS — Z79899 Other long term (current) drug therapy: Secondary | ICD-10-CM | POA: Diagnosis not present

## 2012-12-24 DIAGNOSIS — Z01812 Encounter for preprocedural laboratory examination: Secondary | ICD-10-CM | POA: Diagnosis not present

## 2012-12-24 DIAGNOSIS — Z01810 Encounter for preprocedural cardiovascular examination: Secondary | ICD-10-CM | POA: Diagnosis not present

## 2012-12-24 DIAGNOSIS — Z01818 Encounter for other preprocedural examination: Secondary | ICD-10-CM | POA: Diagnosis not present

## 2012-12-24 DIAGNOSIS — S82843A Displaced bimalleolar fracture of unspecified lower leg, initial encounter for closed fracture: Secondary | ICD-10-CM | POA: Diagnosis not present

## 2012-12-24 DIAGNOSIS — E7800 Pure hypercholesterolemia, unspecified: Secondary | ICD-10-CM | POA: Diagnosis not present

## 2012-12-24 DIAGNOSIS — R279 Unspecified lack of coordination: Secondary | ICD-10-CM | POA: Diagnosis not present

## 2012-12-24 DIAGNOSIS — R262 Difficulty in walking, not elsewhere classified: Secondary | ICD-10-CM | POA: Diagnosis not present

## 2012-12-24 DIAGNOSIS — M129 Arthropathy, unspecified: Secondary | ICD-10-CM | POA: Diagnosis not present

## 2012-12-24 HISTORY — DX: Adverse effect of unspecified anesthetic, initial encounter: T41.45XA

## 2012-12-24 HISTORY — DX: Personal history of other diseases of the digestive system: Z87.19

## 2012-12-24 HISTORY — DX: Gastro-esophageal reflux disease without esophagitis: K21.9

## 2012-12-24 HISTORY — DX: Unspecified asthma, uncomplicated: J45.909

## 2012-12-24 HISTORY — DX: Headache: R51

## 2012-12-24 HISTORY — DX: Other complications of anesthesia, initial encounter: T88.59XA

## 2012-12-24 HISTORY — DX: Malignant (primary) neoplasm, unspecified: C80.1

## 2012-12-24 HISTORY — DX: Unspecified osteoarthritis, unspecified site: M19.90

## 2012-12-24 LAB — BASIC METABOLIC PANEL
BUN: 19 mg/dL (ref 6–23)
CO2: 27 mEq/L (ref 19–32)
Calcium: 9.4 mg/dL (ref 8.4–10.5)
Chloride: 101 mEq/L (ref 96–112)
Creatinine, Ser: 0.8 mg/dL (ref 0.50–1.10)
GFR calc Af Amer: 83 mL/min — ABNORMAL LOW (ref >90)
GFR calc non Af Amer: 72 mL/min — ABNORMAL LOW (ref >90)
Glucose, Bld: 115 mg/dL — ABNORMAL HIGH (ref 70–99)
Potassium: 3.6 mEq/L (ref 3.5–5.1)
Sodium: 139 mEq/L (ref 135–145)

## 2012-12-24 LAB — CBC
HCT: 39.6 % (ref 36.0–46.0)
Hemoglobin: 13.3 g/dL (ref 12.0–15.0)
MCH: 30.4 pg (ref 26.0–34.0)
MCHC: 33.6 g/dL (ref 30.0–36.0)
MCV: 90.6 fL (ref 78.0–100.0)
Platelets: 308 10*3/uL (ref 150–400)
RBC: 4.37 MIL/uL (ref 3.87–5.11)
RDW: 13.1 % (ref 11.5–15.5)
WBC: 9.9 10*3/uL (ref 4.0–10.5)

## 2012-12-24 MED ORDER — CEFAZOLIN SODIUM-DEXTROSE 2-3 GM-% IV SOLR
2.0000 g | INTRAVENOUS | Status: AC
  Administered 2012-12-25: 2 g via INTRAVENOUS
  Filled 2012-12-24: qty 50

## 2012-12-24 NOTE — Pre-Procedure Instructions (Signed)
PAMMIE CHIRINO  12/24/2012   Your procedure is scheduled on:  December 25, 2012 at 8:30 AM  Report to [facility] at 6:30 AM.  Call this number if you have problems the morning of surgery: [phone]   Remember:   Do not eat food or drink liquids after midnight.   Take these medicines the morning of surgery with A SIP OF WATER: famotidine (PEPCID), omeprazole (PRILOSEC), oxyCODONE-acetaminophen (PERCOCET/ROXICET) - if needed      Do not wear jewelry, make-up or nail polish.  Do not wear lotions, powders, or perfumes. You may wear deodorant.  Do not shave 48 hours prior to surgery. Men may shave face and neck.  Do not bring valuables to the hospital.  [facility] is not responsible                   for any belongings or valuables.  Contacts, dentures or bridgework may not be worn into surgery.  Leave suitcase in the car. After surgery it may be brought to your room.  For patients admitted to the hospital, checkout time is 11:00 AM the day of  discharge.     Special Instructions: Shower using CHG 2 nights before surgery and the night before surgery.  If you shower the day of surgery use CHG.  Use special wash - you have one bottle of CHG for all showers.  You should use approximately 1/3 of the bottle for each shower.   Please read over the following fact sheets that you were given: Pain Booklet, Coughing and Deep Breathing and Surgical Site Infection Prevention

## 2012-12-25 ENCOUNTER — Ambulatory Visit (HOSPITAL_COMMUNITY): Payer: Medicare Other

## 2012-12-25 ENCOUNTER — Encounter (HOSPITAL_COMMUNITY): Payer: Self-pay | Admitting: *Deleted

## 2012-12-25 ENCOUNTER — Encounter (HOSPITAL_COMMUNITY)
Admission: RE | Disposition: A | Discharge status: Home / Self Care | Payer: Self-pay | Source: Ambulatory Visit | Attending: Orthopaedic Surgery

## 2012-12-25 ENCOUNTER — Encounter (HOSPITAL_COMMUNITY): Payer: Self-pay | Admitting: Anesthesiology

## 2012-12-25 ENCOUNTER — Observation Stay (HOSPITAL_COMMUNITY)
Admission: RE | Admit: 2012-12-25 | Discharge: 2012-12-27 | Disposition: A | Discharge status: Home / Self Care | Payer: Medicare Other | Source: Ambulatory Visit | Attending: Orthopaedic Surgery | Admitting: Orthopaedic Surgery

## 2012-12-25 ENCOUNTER — Ambulatory Visit (HOSPITAL_COMMUNITY): Payer: Medicare Other | Admitting: Anesthesiology

## 2012-12-25 DIAGNOSIS — S82843A Displaced bimalleolar fracture of unspecified lower leg, initial encounter for closed fracture: Principal | ICD-10-CM | POA: Insufficient documentation

## 2012-12-25 DIAGNOSIS — M6281 Muscle weakness (generalized): Secondary | ICD-10-CM | POA: Insufficient documentation

## 2012-12-25 DIAGNOSIS — S82891A Other fracture of right lower leg, initial encounter for closed fracture: Secondary | ICD-10-CM

## 2012-12-25 DIAGNOSIS — Z4789 Encounter for other orthopedic aftercare: Secondary | ICD-10-CM | POA: Diagnosis not present

## 2012-12-25 DIAGNOSIS — Z01818 Encounter for other preprocedural examination: Secondary | ICD-10-CM | POA: Insufficient documentation

## 2012-12-25 DIAGNOSIS — E7800 Pure hypercholesterolemia, unspecified: Secondary | ICD-10-CM | POA: Diagnosis not present

## 2012-12-25 DIAGNOSIS — I10 Essential (primary) hypertension: Secondary | ICD-10-CM | POA: Diagnosis not present

## 2012-12-25 DIAGNOSIS — Z01812 Encounter for preprocedural laboratory examination: Secondary | ICD-10-CM | POA: Insufficient documentation

## 2012-12-25 DIAGNOSIS — K219 Gastro-esophageal reflux disease without esophagitis: Secondary | ICD-10-CM | POA: Diagnosis not present

## 2012-12-25 DIAGNOSIS — M129 Arthropathy, unspecified: Secondary | ICD-10-CM | POA: Insufficient documentation

## 2012-12-25 DIAGNOSIS — R279 Unspecified lack of coordination: Secondary | ICD-10-CM | POA: Insufficient documentation

## 2012-12-25 DIAGNOSIS — S8253XA Displaced fracture of medial malleolus of unspecified tibia, initial encounter for closed fracture: Secondary | ICD-10-CM | POA: Diagnosis not present

## 2012-12-25 DIAGNOSIS — X58XXXA Exposure to other specified factors, initial encounter: Secondary | ICD-10-CM | POA: Insufficient documentation

## 2012-12-25 DIAGNOSIS — R262 Difficulty in walking, not elsewhere classified: Secondary | ICD-10-CM | POA: Diagnosis not present

## 2012-12-25 DIAGNOSIS — Z01810 Encounter for preprocedural cardiovascular examination: Secondary | ICD-10-CM | POA: Insufficient documentation

## 2012-12-25 HISTORY — PX: ORIF ANKLE FRACTURE: SHX5408

## 2012-12-25 SURGERY — OPEN REDUCTION INTERNAL FIXATION (ORIF) ANKLE FRACTURE
Anesthesia: General | Site: Ankle | Laterality: Right | Wound class: Clean

## 2012-12-25 MED ORDER — KETOROLAC TROMETHAMINE 15 MG/ML IJ SOLN
15.0000 mg | Freq: Four times a day (QID) | INTRAMUSCULAR | Status: DC | PRN
  Filled 2012-12-25: qty 1

## 2012-12-25 MED ORDER — DIPHENHYDRAMINE HCL 12.5 MG/5ML PO ELIX: 25.0000 mg | ORAL_SOLUTION | ORAL | Status: DC | PRN

## 2012-12-25 MED ORDER — ONDANSETRON HCL 4 MG PO TABS: 4.0000 mg | ORAL_TABLET | Freq: Four times a day (QID) | ORAL | Status: DC | PRN

## 2012-12-25 MED ORDER — FENTANYL CITRATE 0.05 MG/ML IJ SOLN
INTRAMUSCULAR | Status: DC | PRN
  Administered 2012-12-25 (×10): 50 ug via INTRAVENOUS

## 2012-12-25 MED ORDER — POLYETHYLENE GLYCOL 3350 17 G PO PACK: 17.0000 g | PACK | Freq: Every day | ORAL | Status: DC | PRN

## 2012-12-25 MED ORDER — PHENYLEPHRINE HCL 10 MG/ML IJ SOLN
INTRAMUSCULAR | Status: DC | PRN
  Administered 2012-12-25 (×3): 80 ug via INTRAVENOUS

## 2012-12-25 MED ORDER — OXYCODONE HCL 5 MG PO TABS
5.0000 mg | ORAL_TABLET | ORAL | Status: DC | PRN
  Administered 2012-12-25 – 2012-12-27 (×5): 10 mg via ORAL
  Filled 2012-12-25 (×6): qty 2

## 2012-12-25 MED ORDER — HYDROMORPHONE HCL PF 1 MG/ML IJ SOLN
INTRAMUSCULAR | Status: AC
  Filled 2012-12-25: qty 1

## 2012-12-25 MED ORDER — VITAMIN D (ERGOCALCIFEROL) 1.25 MG (50000 UNIT) PO CAPS
50000.0000 [IU] | ORAL_CAPSULE | ORAL | Status: DC
  Administered 2012-12-26: 50000 [IU] via ORAL
  Filled 2012-12-25 (×2): qty 1

## 2012-12-25 MED ORDER — MORPHINE SULFATE 2 MG/ML IJ SOLN
INTRAMUSCULAR | Status: AC
  Administered 2012-12-25: 2 mg via INTRAVENOUS
  Filled 2012-12-25: qty 1

## 2012-12-25 MED ORDER — HYDROMORPHONE HCL PF 1 MG/ML IJ SOLN
INTRAMUSCULAR | Status: AC
  Administered 2012-12-25: 0.5 mg via INTRAVENOUS
  Filled 2012-12-25: qty 1

## 2012-12-25 MED ORDER — MAGNESIUM CITRATE PO SOLN
1.0000 | Freq: Once | ORAL | Status: AC | PRN
  Filled 2012-12-25: qty 296

## 2012-12-25 MED ORDER — ASPIRIN EC 81 MG PO TBEC
81.0000 mg | DELAYED_RELEASE_TABLET | Freq: Every day | ORAL | Status: DC
  Administered 2012-12-26 – 2012-12-27 (×2): 81 mg via ORAL
  Filled 2012-12-25 (×3): qty 1

## 2012-12-25 MED ORDER — CALCIUM CARBONATE 1250 (500 CA) MG PO TABS
1.0000 | ORAL_TABLET | Freq: Every day | ORAL | Status: DC
  Administered 2012-12-26 – 2012-12-27 (×2): 500 mg via ORAL
  Filled 2012-12-25 (×4): qty 1

## 2012-12-25 MED ORDER — LACTATED RINGERS IV SOLN
INTRAVENOUS | Status: DC | PRN
  Administered 2012-12-25 (×2): via INTRAVENOUS

## 2012-12-25 MED ORDER — OXYCODONE HCL 5 MG PO TABS
ORAL_TABLET | ORAL | Status: AC
  Administered 2012-12-25: 5 mg
  Filled 2012-12-25: qty 1

## 2012-12-25 MED ORDER — CEFAZOLIN SODIUM 1-5 GM-% IV SOLN
1.0000 g | Freq: Four times a day (QID) | INTRAVENOUS | Status: AC
  Administered 2012-12-25 – 2012-12-26 (×3): 1 g via INTRAVENOUS
  Filled 2012-12-25 (×3): qty 50

## 2012-12-25 MED ORDER — SENNA 8.6 MG PO TABS
1.0000 | ORAL_TABLET | Freq: Two times a day (BID) | ORAL | Status: DC
  Administered 2012-12-26 – 2012-12-27 (×4): 8.6 mg via ORAL
  Filled 2012-12-25 (×7): qty 1

## 2012-12-25 MED ORDER — ASPIRIN 81 MG PO CHEW
CHEWABLE_TABLET | ORAL | Status: AC
  Administered 2012-12-25: 81 mg
  Filled 2012-12-25: qty 1

## 2012-12-25 MED ORDER — HYDROMORPHONE HCL PF 1 MG/ML IJ SOLN
0.2500 mg | INTRAMUSCULAR | Status: DC | PRN
  Administered 2012-12-25 (×3): 0.5 mg via INTRAVENOUS

## 2012-12-25 MED ORDER — METOCLOPRAMIDE HCL 10 MG PO TABS
5.0000 mg | ORAL_TABLET | Freq: Three times a day (TID) | ORAL | Status: DC | PRN
Start: 2012-12-25 — End: 2012-12-27

## 2012-12-25 MED ORDER — LIDOCAINE HCL (CARDIAC) 20 MG/ML IV SOLN
INTRAVENOUS | Status: DC | PRN
  Administered 2012-12-25: 50 mg via INTRAVENOUS

## 2012-12-25 MED ORDER — SODIUM CHLORIDE 0.9 % IV SOLN
INTRAVENOUS | Status: DC
  Administered 2012-12-25 – 2012-12-27 (×3): via INTRAVENOUS

## 2012-12-25 MED ORDER — SORBITOL 70 % SOLN: 30.0000 mL | Freq: Every day | Status: DC | PRN

## 2012-12-25 MED ORDER — ONDANSETRON HCL 4 MG/2ML IJ SOLN
4.0000 mg | Freq: Four times a day (QID) | INTRAMUSCULAR | Status: DC | PRN
  Administered 2012-12-25 – 2012-12-26 (×2): 4 mg via INTRAVENOUS
  Filled 2012-12-25 (×2): qty 2

## 2012-12-25 MED ORDER — ALBUTEROL SULFATE HFA 108 (90 BASE) MCG/ACT IN AERS
INHALATION_SPRAY | RESPIRATORY_TRACT | Status: DC | PRN
  Administered 2012-12-25: 2 via RESPIRATORY_TRACT

## 2012-12-25 MED ORDER — MORPHINE SULFATE 2 MG/ML IJ SOLN
1.0000 mg | INTRAMUSCULAR | Status: DC | PRN
  Administered 2012-12-25 – 2012-12-26 (×6): 1 mg via INTRAVENOUS
  Filled 2012-12-25 (×6): qty 1

## 2012-12-25 MED ORDER — BUPIVACAINE HCL (PF) 0.25 % IJ SOLN
INTRAMUSCULAR | Status: DC | PRN
  Administered 2012-12-25: 10 mL

## 2012-12-25 MED ORDER — PROPOFOL 10 MG/ML IV BOLUS
INTRAVENOUS | Status: DC | PRN
  Administered 2012-12-25: 150 mg via INTRAVENOUS
  Administered 2012-12-25: 50 mg via INTRAVENOUS

## 2012-12-25 MED ORDER — METOCLOPRAMIDE HCL 5 MG/ML IJ SOLN: 5.0000 mg | Freq: Three times a day (TID) | INTRAMUSCULAR | Status: DC | PRN

## 2012-12-25 MED ORDER — HYDROCODONE-ACETAMINOPHEN 5-325 MG PO TABS
1.0000 | ORAL_TABLET | ORAL | Status: DC | PRN
  Administered 2012-12-25 – 2012-12-27 (×5): 2 via ORAL
  Filled 2012-12-25 (×5): qty 2

## 2012-12-25 MED ORDER — BUPIVACAINE HCL (PF) 0.25 % IJ SOLN
INTRAMUSCULAR | Status: AC
  Filled 2012-12-25: qty 20

## 2012-12-25 SURGICAL SUPPLY — 74 items
1.3 KWIRE ×2 IMPLANT
4.0 x 35 cannulated screw (Orthopedic Implant) ×1 IMPLANT
BANDAGE ELASTIC 6 VELCRO ST LF (GAUZE/BANDAGES/DRESSINGS) ×1 IMPLANT
BANDAGE ESMARK 6X9 LF (GAUZE/BANDAGES/DRESSINGS) IMPLANT
BIT DRILL 2.7 QC CANN 155 (BIT) ×1 IMPLANT
BIT DRILL 3.5 QC 155 (BIT) ×1 IMPLANT
BIT DRILL QC 2.7 6.3IN  SHORT (BIT) ×1
BIT DRILL QC 2.7 6.3IN SHORT (BIT) IMPLANT
BNDG CMPR 9X6 STRL LF SNTH (GAUZE/BANDAGES/DRESSINGS)
BNDG COHESIVE 4X5 TAN STRL (GAUZE/BANDAGES/DRESSINGS) ×2 IMPLANT
BNDG ESMARK 6X9 LF (GAUZE/BANDAGES/DRESSINGS)
BNDG GAUZE STRTCH 6 (GAUZE/BANDAGES/DRESSINGS) ×2 IMPLANT
CLOTH BEACON ORANGE TIMEOUT ST (SAFETY) ×2 IMPLANT
COVER SURGICAL LIGHT HANDLE (MISCELLANEOUS) ×2 IMPLANT
CUFF TOURNIQUET SINGLE 34IN LL (TOURNIQUET CUFF) ×1 IMPLANT
CUFF TOURNIQUET SINGLE 44IN (TOURNIQUET CUFF) IMPLANT
DRAPE C-ARM MINI 42X72 WSTRAPS (DRAPES) ×1 IMPLANT
DRAPE INCISE IOBAN 66X45 STRL (DRAPES) ×2 IMPLANT
DRAPE PROXIMA HALF (DRAPES) ×2 IMPLANT
DRAPE U-SHAPE 47X51 STRL (DRAPES) ×2 IMPLANT
DRSG ADAPTIC 3X8 NADH LF (GAUZE/BANDAGES/DRESSINGS) ×2 IMPLANT
DURAPREP 26ML APPLICATOR (WOUND CARE) ×2 IMPLANT
ELECT REM PT RETURN 9FT ADLT (ELECTROSURGICAL) ×2
ELECTRODE REM PT RTRN 9FT ADLT (ELECTROSURGICAL) ×1 IMPLANT
GAUZE XEROFORM 5X9 LF (GAUZE/BANDAGES/DRESSINGS) ×1 IMPLANT
GLOVE BIOGEL PI IND STRL 9 (GLOVE) ×1 IMPLANT
GLOVE BIOGEL PI INDICATOR 9 (GLOVE)
GLOVE BIOGEL PI ORTHO PRO 7.5 (GLOVE) ×1
GLOVE PI ORTHO PRO STRL 7.5 (GLOVE) IMPLANT
GLOVE SURG ORTHO 9.0 STRL STRW (GLOVE) ×1 IMPLANT
GLOVE SURG SS PI 7.5 STRL IVOR (GLOVE) ×1 IMPLANT
GOWN PREVENTION PLUS XLARGE (GOWN DISPOSABLE) ×2 IMPLANT
GOWN SRG XL XLNG 56XLVL 4 (GOWN DISPOSABLE) ×2 IMPLANT
GOWN STRL NON-REIN XL XLG LVL4 (GOWN DISPOSABLE) ×4
K-WIRE 1.6 (WIRE) ×2
K-WIRE FX150X1.6XTROC PNT (WIRE) ×1
KIT BASIN OR (CUSTOM PROCEDURE TRAY) ×2 IMPLANT
KIT ROOM TURNOVER OR (KITS) ×2 IMPLANT
KWIRE FX150X1.6XTROC PNT (WIRE) IMPLANT
MANIFOLD NEPTUNE II (INSTRUMENTS) ×2 IMPLANT
NEEDLE 22X1 1/2 (OR ONLY) (NEEDLE) ×1 IMPLANT
NS IRRIG 1000ML POUR BTL (IV SOLUTION) ×2 IMPLANT
PACK ORTHO EXTREMITY (CUSTOM PROCEDURE TRAY) ×2 IMPLANT
PAD ARMBOARD 7.5X6 YLW CONV (MISCELLANEOUS) ×4 IMPLANT
PADDING CAST ABS 6INX4YD NS (CAST SUPPLIES) ×1
PADDING CAST ABS COTTON 6X4 NS (CAST SUPPLIES) IMPLANT
PADDING CAST COTTON 6X4 STRL (CAST SUPPLIES) ×2 IMPLANT
PLATE FIBULA DISTAL 9 HOLE (Plate) ×1 IMPLANT
SCREW 5.0X12 (Screw) ×1 IMPLANT
SCREW CANC 5.0X14 (Screw) ×2 IMPLANT
SCREW CANNULATED 4.0X36 (Screw) ×1 IMPLANT
SCREW LOCK 3.5X14 (Screw) ×1 IMPLANT
SCREW NL 3.5X14 (Screw) ×2 IMPLANT
SCREW NLCK 16X3.5XST CORT PRLC (Screw) IMPLANT
SCREW NON LOCK 3.5X12 (Screw) ×1 IMPLANT
SCREW NON LOCKING 3.5X48 (Screw) ×2 IMPLANT
SCREW NONLOCK 3.5X10 (Screw) ×3 IMPLANT
SCREW NONLOCK 3.5X16 (Screw) ×2 IMPLANT
SPONGE GAUZE 4X4 12PLY (GAUZE/BANDAGES/DRESSINGS) ×2 IMPLANT
SPONGE LAP 18X18 X RAY DECT (DISPOSABLE) ×3 IMPLANT
STAPLER VISISTAT 35W (STAPLE) IMPLANT
SUCTION FRAZIER TIP 10 FR DISP (SUCTIONS) ×2 IMPLANT
SUT ETHILON 2 0 PSLX (SUTURE) IMPLANT
SUT ETHILON 3 0 PS 1 (SUTURE) ×3 IMPLANT
SUT VIC AB 0 CT1 27 (SUTURE) ×4
SUT VIC AB 0 CT1 27XBRD ANBCTR (SUTURE) IMPLANT
SUT VIC AB 2-0 CT1 27 (SUTURE) ×4
SUT VIC AB 2-0 CT1 TAPERPNT 27 (SUTURE) IMPLANT
SUT VIC AB 2-0 CTB1 (SUTURE) ×2 IMPLANT
SYR CONTROL 10ML LL (SYRINGE) ×1 IMPLANT
TOWEL OR 17X24 6PK STRL BLUE (TOWEL DISPOSABLE) ×3 IMPLANT
TOWEL OR 17X26 10 PK STRL BLUE (TOWEL DISPOSABLE) ×2 IMPLANT
TUBE CONNECTING 12X1/4 (SUCTIONS) ×2 IMPLANT
WATER STERILE IRR 1000ML POUR (IV SOLUTION) ×2 IMPLANT

## 2012-12-25 NOTE — Preoperative (Signed)
Beta Blockers   Reason not to administer Beta Blockers:Not Applicable 

## 2012-12-25 NOTE — Anesthesia Preprocedure Evaluation (Addendum)
Anesthesia Evaluation   Patient awake    Reviewed: Allergy & Precautions, H&P , NPO status , Patient's Chart, lab work & pertinent test results  Airway Mallampati: II      Dental   Pulmonary asthma ,          Cardiovascular hypertension, Rhythm:Regular Rate:Normal     Neuro/Psych  Headaches,    GI/Hepatic Neg liver ROS, hiatal hernia, GERD-  ,  Endo/Other  negative endocrine ROS  Renal/GU negative Renal ROS     Musculoskeletal   Abdominal   Peds  Hematology   Anesthesia Other Findings   Reproductive/Obstetrics                          Anesthesia Physical Anesthesia Plan  ASA: III  Anesthesia Plan: General   Post-op Pain Management:    Induction: Intravenous  Airway Management Planned: LMA  Additional Equipment:   Intra-op Plan:   Post-operative Plan: Extubation in OR  Informed Consent: I have reviewed the patients History and Physical, chart, labs and discussed the procedure including the risks, benefits and alternatives for the proposed anesthesia with the patient or authorized representative who has indicated his/her understanding and acceptance.     Plan Discussed with: CRNA and Anesthesiologist  Anesthesia Plan Comments:         Anesthesia Quick Evaluation

## 2012-12-25 NOTE — Op Note (Signed)
Date of Surgery: 12/25/2012  INDICATIONS: Connie Weber is a 72 y.o.-year-old female who sustained a closed bimalleolar fracture; she was indicated for operative fixation due to the displaced and unstable nature of the fracture and came to the operating room today for this procedure. The patient did consent to the procedure after discussion of the risks and benefits.   PREOPERATIVE DIAGNOSIS: right bimalleolar ankle fracture   POSTOPERATIVE DIAGNOSIS: Same.  PROCEDURE: Open treatment of bimalleolar ankle fracture. CPT [phone]  SURGEON: N. Glee Arvin, M.D.  ANESTHESIA: general  IV FLUIDS AND URINE: See anesthesia.  ESTIMATED BLOOD LOSS: minimal mL.  IMPLANTS: [employer] 9-hole fibular plate  DRAINS: None.  COMPLICATIONS: None.  DESCRIPTION OF PROCEDURE: The patient was brought to the operating room and placed supine on the operating table.  The patient had been signed prior to the procedure.  The patient had the anesthesia placed by the anesthesiologist.  The prep verification and incision time-outs were performed to confirm that this was the correct patient, site, side and location. The patient had SCDs in place on the opposite lower extremity. The patient did receive antibiotics prior to the incision and was re-dosed during the procedure as needed at indicated intervals.  A well padded nonsterile tourniquet was placed on the upper thigh.  The lower extremity was prepped and draped in the standard fashion.   The bony landmarks were palpated.  A lateral incision based over the fibula was used.  Blunt dissection was carried down to the fascia.  The superficial peroneal nerve was not encountered.  The fascia was sharply incised and lifted off the fibula.  The fracture sites were debrided of soft tissue and hematoma.  The fractures were reduced.  Given the nature of the fracture and osteopenia, lag screws could not be placed.  A long bridge plate was used to internally fix the fibula.  Orthogonal  views on xray were used to confirm adequate reduction and plate placement.  We then turned our attention to the medial malleolar fracture.  A medial incision over the malleolus was used.  Blunt dissection was carried down to the bone.  Veins were cauterized.  Periosteum within the fracture site was cleared out.  Reduction was achieved using fracture clamps.  This was confirmed on xray.  Two parallel cannulated screws were advanced up the malleolus and tibia.  We then stressed the ankle which showed widening of the medial clear space.  A king tong was used to reduce the syndesmosis and a syndesmotic screw was placed.  Final xrays were taken.  The incisions were closed in with 0 vicryl for the fascia, 2.0 vicryl for the subcuticular layer and 3.0 nylon for the skin.  The wounds were cleaned and dried a final time and a sterile dressing and short leg splint were placed. The patient was then transferred back to the bed and left the operating room in stable condition.  All sponge and instrument counts were correct.  POSTOPERATIVE PLAN: Ms. Cephus will remain non weight bearing with the leg elevated.  she will be admitted overnight for pain control and physical therapy in the morning.  Ms. Faraj will be on aspirin 325 BID for DVT prophylaxis.

## 2012-12-25 NOTE — H&P (Signed)
H&P update  The surgical history has been reviewed and remains accurate without interval change.  The patient was re-examined and patient's physiologic condition has not changed significantly in the last 30 days. The condition still exists that makes this procedure necessary. The treatment plan remains the same, without new options for care.  No new pharmacological allergies or types of therapy has been initiated that would change the plan or the appropriateness of the plan.  The patient and/or family understand the potential benefits and risks.  N. Michael Elloise Roark, MD 12/25/2012 8:15 AM   

## 2012-12-25 NOTE — Brief Op Note (Signed)
Brief Op Note  Date of Surgery: 12/25/2012  Preoperative Diagnosis: Right ankle fracture  Postoperative Diagnosis: same  Procedure: Procedure(s): OPEN REDUCTION INTERNAL FIXATION (ORIF) RIGHT ANKLE FRACTURE  Implants: Katrinka Blazing and Nephew   Surgeons: Surgeon(s): Giah Fickett Glee Arvin, MD  Anesthesia: Choice  Drains: none  Estimated Blood Loss: minimal  Complications: None  Condition to PACU: Stable  Darlina Mccaughey Glee Arvin, MD [facility] 12/25/2012 11:31 AM

## 2012-12-25 NOTE — Transfer of Care (Signed)
Immediate Anesthesia Transfer of Care Note  Patient: Connie Weber  Procedure(s) Performed: Procedure(s): OPEN REDUCTION INTERNAL FIXATION (ORIF) RIGHT ANKLE FRACTURE (Right)  Patient Location: PACU  Anesthesia Type:General  Level of Consciousness: awake, oriented and patient cooperative  Airway & Oxygen Therapy: Patient Spontanous Breathing and Patient connected to face mask oxygen  Post-op Assessment: Report given to PACU RN and Post -op Vital signs reviewed and stable  Post vital signs: Reviewed and stable  Complications: No apparent anesthesia complications

## 2012-12-25 NOTE — Anesthesia Procedure Notes (Signed)
Procedure Name: LMA Insertion Date/Time: 12/25/2012 8:33 AM Performed by: Coralee Rud Pre-anesthesia Checklist: Patient identified Patient Re-evaluated:Patient Re-evaluated prior to inductionOxygen Delivery Method: Circle system utilized Preoxygenation: Pre-oxygenation with 100% oxygen Intubation Type: IV induction Ventilation: Mask ventilation without difficulty LMA: LMA with gastric port inserted LMA Size: 4.0 Number of attempts: 1

## 2012-12-25 NOTE — Progress Notes (Signed)
UR COMPLETED  

## 2012-12-25 NOTE — Anesthesia Postprocedure Evaluation (Signed)
  Anesthesia Post-op Note  Patient: Connie Weber  Procedure(s) Performed: Procedure(s): OPEN REDUCTION INTERNAL FIXATION (ORIF) RIGHT ANKLE FRACTURE (Right)  Patient Location: PACU  Anesthesia Type:General  Level of Consciousness: awake  Airway and Oxygen Therapy: Patient Spontanous Breathing  Post-op Pain: mild  Post-op Assessment: Post-op Vital signs reviewed  Post-op Vital Signs: Reviewed  Complications: No apparent anesthesia complications

## 2012-12-26 MED ORDER — KETOROLAC TROMETHAMINE 30 MG/ML IJ SOLN
INTRAMUSCULAR | Status: AC
  Administered 2012-12-26: 15 mg
  Filled 2012-12-26: qty 1

## 2012-12-26 MED ORDER — PROMETHAZINE HCL 25 MG PO TABS: 25.0000 mg | ORAL_TABLET | Freq: Four times a day (QID) | ORAL | Status: DC | PRN

## 2012-12-26 MED ORDER — ZINC GLUCONATE 50 MG PO TABS: 50.0000 mg | ORAL_TABLET | Freq: Every day | ORAL | Status: AC

## 2012-12-26 MED ORDER — OXYCODONE HCL 5 MG PO TABS: 5.0000 mg | ORAL_TABLET | ORAL | Status: DC | PRN

## 2012-12-26 MED ORDER — KETOROLAC TROMETHAMINE 30 MG/ML IJ SOLN
INTRAMUSCULAR | Status: AC
  Filled 2012-12-26: qty 1

## 2012-12-26 MED ORDER — ASPIRIN 325 MG PO TABS: 325.0000 mg | ORAL_TABLET | Freq: Two times a day (BID) | ORAL | Status: DC

## 2012-12-26 NOTE — Evaluation (Signed)
Physical Therapy Evaluation Patient Details Name: Connie Weber MRN: [id number] DOB: 05-Oct-1940 Today's Date: 12/26/2012 Time:  -     PT Assessment / Plan / Recommendation History of Present Illness  Displaced bimalleolar ankle fracture s/p R Ankle ORIF  Clinical Impression  Patient is s/p R Ankle ORIF surgery resulting in functional limitations due to the deficits listed below (see PT Problem List).  Patient will benefit from skilled PT to increase their independence and safety with mobility to allow discharge to the venue listed below. Pt unable to tolerate step training today due to nausea and vomiting. Recommend no d/c today. PT to see pt tomorrow to focus on increasing Independence with short distance gait and step training. Pt previously used crutches and PT will need to decide to safest assistive device with mobility and steps.    PT Assessment  Patient needs continued PT services    Follow Up Recommendations  Home health PT    Does the patient have the potential to tolerate intense rehabilitation      Barriers to Discharge        Equipment Recommendations  Crutches    Recommendations for Other Services     Frequency Min 5X/week    Precautions / Restrictions Precautions Precautions: Fall Precaution Comments: impulsive Restrictions Weight Bearing Restrictions: Yes RLE Weight Bearing: Non weight bearing   Pertinent Vitals/Pain       Mobility  Bed Mobility Bed Mobility: Supine to Sit;Sitting - Scoot to Edge of Bed Supine to Sit: 4: Min guard Sitting - Scoot to [place] of Bed: 4: Min guard Details for Bed Mobility Assistance: cues for technique and to slow down, vomited Transfers Transfers: Sit to Stand;Stand to [employer] to Stand: 4: Min assist;With upper extremity assist Stand to Sit: 4: Min assist;With upper extremity assist Details for Transfer Assistance: cues for hand placement with RW. Pt is use to using crutches. Pt had uncontrolled descent to chair due to  fatigue. Ambulation/Gait Ambulation/Gait Assistance: 4: Min assist Ambulation Distance (Feet): 6 Feet Assistive device: Rolling walker Ambulation/Gait Assistance Details: Cues for technique and walker placement. Pt was able to maintain R NWB short distances Gait Pattern: Step-to pattern Gait velocity: pt implusive-asked her to slow down for increased safety. Stairs: No    Exercises     PT Diagnosis: Difficulty walking  PT Problem List: Decreased strength;Decreased range of motion;Decreased activity tolerance;Decreased balance;Decreased mobility;Decreased knowledge of use of DME;Decreased safety awareness PT Treatment Interventions: DME instruction;Gait training;Stair training;Functional mobility training;Therapeutic activities;Therapeutic exercise;Balance training;Patient/family education     PT Goals(Current goals can be found in the care plan section) Acute Rehab PT Goals Patient Stated Goal: To take carwe of myself. PT Goal Formulation: With patient Time For Goal Achievement: 01/02/13 Potential to Achieve Goals: Good  Visit Information  Last PT Received On: 12/26/12 Assistance Needed: +1 History of Present Illness: Displaced bimalleolar ankle fracture s/p R Ankle ORIF       Prior Functioning  Home Living Family/patient expects to be discharged to:: Private residence Living Arrangements: Spouse/significant other Available Help at Discharge: Family Type of Home: House Home Access: Stairs to enter Secretary/administrator of Steps: 3 Entrance Stairs-Rails: None Home Layout: One level Home Equipment: Walker - 2 wheels;Crutches Additional Comments: crutches are too tall. will need a new set. Prior Function Level of Independence: Independent with assistive device(s) Communication Communication: No difficulties    Cognition  Cognition Arousal/Alertness: Suspect due to medications (decreased arousal) Behavior During Therapy: Restless;Impulsive Overall Cognitive Status:  Within Functional Limits  for tasks assessed    Extremity/Trunk Assessment Upper Extremity Assessment Upper Extremity Assessment: Defer to OT evaluation Lower Extremity Assessment Lower Extremity Assessment: RLE deficits/detail RLE: Unable to fully assess due to immobilization;Unable to fully assess due to pain Cervical / Trunk Assessment Cervical / Trunk Assessment: Normal   Balance Balance Balance Assessed: Yes Static Standing Balance Static Standing - Balance Support: Bilateral upper extremity supported Static Standing - Level of Assistance: 5: Stand by assistance  End of Session PT - End of Session Equipment Utilized During Treatment: Gait belt Activity Tolerance: Other (comment) (Nausea and vomiting) Patient left: in chair;with call bell/phone within reach;with family/visitor present Nurse Communication: Mobility status  GP Functional Assessment Tool Used: clinical judgement Functional Limitation: Mobility: Walking and moving around Mobility: Walking and Moving Around Current Status [phone]): At least 20 percent but less than 40 percent impaired, limited or restricted Mobility: Walking and Moving Around Goal Status [phone]): At least 1 percent but less than 20 percent impaired, limited or restricted   Greggory Stallion 12/26/2012, 11:32 AM

## 2012-12-26 NOTE — Progress Notes (Signed)
12/26/12 Spoke with patient and her husband about HHC. They chose [facility] from [facility] agencies list. Shaune Pascal with [facility] and set up HHPT. Faxed facesheet, H and P, op note, PT eval and HHPT order to [phone]. Baxter Hire confirmed fax receipt. Jacquelynn Cree RN, BSN, CCM

## 2012-12-26 NOTE — Progress Notes (Signed)
Subjective:  Patient reports pain as moderate.  Pain and n/v are improved.  Objective:   VITALS:   Filed Vitals:   12/26/12 0017 12/26/12 0326 12/26/12 0355 12/26/12 0620  BP:  133/71  122/67  Pulse:  93  79  Temp: 99.1 F (37.3 C) 100.4 F (38 C) 98.9 F (37.2 C) 98.9 F (37.2 C)  TempSrc:  Oral  Oral  Resp:  14  15  SpO2:  95%  93%    Neurologically intact Neurovascular intact Sensation intact distally Intact pulses distally Incision: dressing C/D/I and no drainage No cellulitis present   Lab Results  Component Value Date   WBC 9.9 12/24/2012   HGB 13.3 12/24/2012   HCT 39.6 12/24/2012   MCV 90.6 12/24/2012   PLT 308 12/24/2012     Assessment/Plan: 1 Day Post-Op   S/p ORIF R ankle  Advance diet Up with therapy DVT ppx Pain control Discharge planning pending PT/OT eval   Cheral Almas 12/26/2012, 7:59 AM [phone]

## 2012-12-26 NOTE — Evaluation (Signed)
Occupational Therapy Evaluation Patient Details Name: Connie Weber MRN: [id number] DOB: [birth year] Today's Date: 12/26/2012 Time: 2440-1027 OT Time Calculation (min): 19 min  OT Assessment / Plan / Recommendation History of present illness  displaced bimalleolar ankle fx now s/p Right ankle ORIF   Clinical Impression   Pt s/p right ankle ORIF.  Pt limited this session due to n/v.  Will continue to follow acutely in order to address below problem list in prep for return home with spouse.    OT Assessment  Patient needs continued OT Services    Follow Up Recommendations  No OT follow up;Supervision/Assistance - 24 hour    Barriers to Discharge      Equipment Recommendations  None recommended by OT    Recommendations for Other Services    Frequency  Min 2X/week    Precautions / Restrictions Precautions Precautions: Fall Restrictions Weight Bearing Restrictions: Yes RLE Weight Bearing: Non weight bearing   Pertinent Vitals/Pain See vitals    ADL  Grooming: Performed;Wash/dry face;Set up Where Assessed - Grooming: Unsupported sitting Upper Body Bathing: Simulated;Set up Where Assessed - Upper Body Bathing: Unsupported sitting Lower Body Bathing: Simulated;Minimal assistance Where Assessed - Lower Body Bathing: Unsupported sitting;Lean right and/or left Upper Body Dressing: Simulated;Set up Where Assessed - Upper Body Dressing: Unsupported sitting Lower Body Dressing: Simulated;Minimal assistance Where Assessed - Lower Body Dressing: Unsupported sitting;Lean right and/or left Toilet Transfer: Performed;Minimal assistance Toilet Transfer Method: Sit to stand Toilet Transfer Equipment:  (bed) Equipment Used: Gait belt;Rolling walker Transfers/Ambulation Related to ADLs: Min assist with RW.  Pt very nauseous and vomited once performing sit<>stand.   ADL Comments: Pt limited by n/v during session.  Able to tolerate sitting EOB ~4 min.  Upon performing sit<>stand at bedside, pt  vomited and needed to sit back down.      OT Diagnosis: Generalized weakness;Acute pain  OT Problem List: Decreased strength;Decreased activity tolerance;Impaired balance (sitting and/or standing);Decreased knowledge of use of DME or AE;Pain OT Treatment Interventions: Self-care/ADL training;DME and/or AE instruction;Therapeutic activities;Patient/family education;Balance training   OT Goals(Current goals can be found in the care plan section) Acute Rehab OT Goals Patient Stated Goal: to be independent OT Goal Formulation: With patient Time For Goal Achievement: 01/02/13 Potential to Achieve Goals: Good  Visit Information  Last OT Received On: 12/26/12 Assistance Needed: +1       Prior Functioning     Home Living Family/patient expects to be discharged to:: Private residence Living Arrangements: Spouse/significant other Available Help at Discharge: Family Type of Home: House Home Access: Stairs to enter Secretary/administrator of Steps: 3 Entrance Stairs-Rails: None Home Layout: One level Home Equipment: Environmental consultant - 2 wheels;Shower seat;Crutches Additional Comments: crutches are too tall. will need a new set. Prior Function Level of Independence: Independent with assistive device(s) Communication Communication: No difficulties         Vision/Perception     Cognition  Cognition Arousal/Alertness: Lethargic Behavior During Therapy: Flat affect Overall Cognitive Status: Within Functional Limits for tasks assessed    Extremity/Trunk Assessment Upper Extremity Assessment Upper Extremity Assessment: Overall WFL for tasks assessed     Mobility Bed Mobility Bed Mobility: Supine to Sit;Sitting - Scoot to Edge of Bed;Sit to Supine Supine to Sit: 4: Min guard;HOB elevated;With rails Sitting - Scoot to Edge of Bed: 4: Min guard Sit to Supine: 4: Min guard;HOB elevated;With rail Details for Bed Mobility Assistance: Incr time Transfers Transfers: Sit to Stand;Stand to  Sit Sit to Stand: 4: Min assist;From bed;With upper  extremity assist Stand to Sit: 4: Min assist;To bed;With upper extremity assist Details for Transfer Assistance: VCs for safe hand placement     Exercise     Balance     End of Session OT - End of Session Equipment Utilized During Treatment: Gait belt;Rolling walker Activity Tolerance: Patient limited by fatigue;Other (comment) (limited by n/v) Patient left: in bed;with call bell/phone within reach;with family/visitor present  GO Functional Assessment Tool Used: clinical judgement Functional Limitation: Self care Self Care Current Status (Z6109): At least 20 percent but less than 40 percent impaired, limited or restricted Self Care Goal Status (U0454): At least 1 percent but less than 20 percent impaired, limited or restricted   12/26/2012 Connie Weber OTR/L Pager [phone] Office [phone]  Connie Weber 12/26/2012, 4:15 PM

## 2012-12-27 NOTE — Progress Notes (Signed)
Physical Therapy Treatment Patient Details Name: Connie Weber MRN: [id number] DOB: [birth year] Today's Date: 12/27/2012 Time: 1030-1105 PT Time Calculation (min): 35 min  PT Assessment / Plan / Recommendation  History of Present Illness Displaced bimalleolar ankle fracture s/p R Ankle ORIF   PT Comments   Patient did better with mobility today.  Able to review up/down steps with patient and husband using crutches.  Patient still appears slightly impulsive and due to that is still a fall risk; feel this is long-term issue and probably pre-morbid.  Feel patient and husband have good understanding of up/down steps.  Husband did report he would get a neighbor to assist for increased safety.  I do recommend f/u HHPT to continue to address progressive ambulation and home safety.    Follow Up Recommendations  Home health PT     Does the patient have the potential to tolerate intense rehabilitation     Barriers to Discharge        Equipment Recommendations  None recommended by PT (obtained crutches for patient and adjusted)    Recommendations for Other Services    Frequency     Progress towards PT Goals Progress towards PT goals: Progressing toward goals  Plan Current plan remains appropriate    Precautions / Restrictions Precautions Precautions: Fall Precaution Comments: impulsive Restrictions RLE Weight Bearing: Non weight bearing   Pertinent Vitals/Pain 5/10 - see pain flowsheet    Mobility  Bed Mobility Details for Bed Mobility Assistance: patient already in chair upon PT arrival Transfers Transfers: Sit to Stand;Stand to Sit Sit to Stand: 5: Supervision;With upper extremity assist;From chair/3-in-1 Stand to Sit: 5: Supervision;To chair/3-in-1;With upper extremity assist Details for Transfer Assistance: VCs for safe hand placement Ambulation/Gait Ambulation/Gait Assistance: 4: Min guard Ambulation Distance (Feet): 10 Feet Assistive device: Crutches Ambulation/Gait Assistance  Details: cueing to slow down, pt impulsive during gait; was able to maintain NWB. Stairs: Yes Stairs Assistance: 4: Min assist Stairs Assistance Details (indicate cue type and reason): cueing for sequencing and instruction to husband to ensure proper gaurding, especially going up.   Stair Management Technique: No rails;Forwards;With crutches Number of Stairs: 2    Exercises     PT Diagnosis:    PT Problem List:   PT Treatment Interventions:     PT Goals (current goals can now be found in the care plan section)    Visit Information  Last PT Received On: 12/27/12 Assistance Needed: +1 History of Present Illness: Displaced bimalleolar ankle fracture s/p R Ankle ORIF    Subjective Data      Cognition  Cognition Arousal/Alertness: Awake/alert Behavior During Therapy: Impulsive Overall Cognitive Status: Within Functional Limits for tasks assessed    Balance  Balance Balance Assessed: Yes Static Standing Balance Static Standing - Balance Support: Bilateral upper extremity supported Static Standing - Level of Assistance: 5: Stand by assistance  End of Session PT - End of Session Equipment Utilized During Treatment: Gait belt Activity Tolerance: Patient tolerated treatment well Patient left: in chair;with call bell/phone within reach;with family/visitor present Nurse Communication: Mobility status   GP Functional Assessment Tool Used: clinical judgement Functional Limitation: Mobility: Walking and moving around Mobility: Walking and Moving Around Goal Status [phone]): At least 1 percent but less than 20 percent impaired, limited or restricted Mobility: Walking and Moving Around Discharge Status [phone]): At least 20 percent but less than 40 percent impaired, limited or restricted   Olivia Canter, [place] [phone] 12/27/2012, 11:33 AM

## 2012-12-27 NOTE — Progress Notes (Signed)
Orthopedic Tech Progress Note Patient Details:  Connie Weber August 10, 1940 [id number] Delivered to PT in [facility] gym Ortho Devices Type of Ortho Device: Crutches Ortho Device/Splint Interventions: Ordered   Asia Burnett Kanaris 12/27/2012, 11:18 AM

## 2012-12-27 NOTE — Clinical Social Work Note (Signed)
LATE DISCHARGE ENTRY: Patient discharged home earlier today. Earlier today CSW was advised by physical therapist after leaving patient's room that patient was discharging home today with husband. PT had just worked with patient and feels patient is safe discharging home with assistance from spouse.  CSW did not talk with patient as plan was set for discharge home.  Genelle Bal, MSW, LCSW [phone]

## 2012-12-27 NOTE — Plan of Care (Signed)
Problem: Acute Rehab PT Goals Goal: Pt Will Ambulate Outcome: Not Met (add Reason) Continues to require min-guard assist for ambulation with crutches x 10 feet

## 2012-12-28 NOTE — Discharge Summary (Signed)
Physician Discharge Summary  Patient ID: Connie Weber MRN: [id number] DOB/AGE: [birth year] 72 y.o.  Admit date: 12/25/2012 Discharge date: 12/28/2012  Admission Diagnoses:  Right ankle fracture  Discharge Diagnoses:  same  Past Medical History  Diagnosis Date  . Hypertension   . Hypercholesteremia   . Asthma   . GERD (gastroesophageal reflux disease)   . H/O hiatal hernia   . Headache(784.0)     migraines as a young adult  . Cancer     basal cell   . Arthritis   . Complication of anesthesia     difficulty waking up, feels that it was due to not having an inhaler used prior to surgery    Surgeries: Procedure(s): OPEN REDUCTION INTERNAL FIXATION (ORIF) RIGHT ANKLE FRACTURE on 12/25/2012   Consultants (if any):    Discharged Condition: Improved  Hospital Course: Connie Weber is an 72 y.o. female who was admitted 12/25/2012 with a diagnosis of right ankle fracture and went to the operating room on 12/25/2012 and underwent the above named procedures.    She was given perioperative antibiotics:  Anti-infectives   Start     Dose/Rate Route Frequency Ordered Stop   12/25/12 1600  ceFAZolin (ANCEF) IVPB 1 g/50 mL premix     1 g 100 mL/hr over 30 Minutes Intravenous Every 6 hours 12/25/12 1346 12/26/12 0331   12/25/12 0600  ceFAZolin (ANCEF) IVPB 2 g/50 mL premix     2 g 100 mL/hr over 30 Minutes Intravenous On call to O.R. 12/24/12 1457 12/25/12 0843    .  She was given sequential compression devices, early ambulation, and aspirin for DVT prophylaxis.  She benefited maximally from the hospital stay and there were no complications.    Recent vital signs:  Filed Vitals:   12/27/12 0535  BP: 148/74  Pulse: 87  Temp: 98.8 F (37.1 C)  Resp: 16    Recent laboratory studies:  Lab Results  Component Value Date   HGB 13.3 12/24/2012   Lab Results  Component Value Date   WBC 9.9 12/24/2012   PLT 308 12/24/2012   No results found for this basename: INR   Lab Results  Component  Value Date   NA 139 12/24/2012   K 3.6 12/24/2012   CL 101 12/24/2012   CO2 27 12/24/2012   BUN 19 12/24/2012   CREATININE 0.80 12/24/2012   GLUCOSE 115* 12/24/2012    Discharge Medications:     Medication List    STOP taking these medications       oxyCODONE-acetaminophen 5-325 MG per tablet  Commonly known as:  PERCOCET/ROXICET      TAKE these medications       aspirin 325 MG tablet  Take 1 tablet (325 mg total) by mouth 2 (two) times daily.     CALTRATE 600+D 600-400 MG-UNIT per tablet  Generic drug:  Calcium Carbonate-Vitamin D  Take 1 tablet by mouth 2 (two) times daily.     famotidine 20 MG tablet  Commonly known as:  PEPCID  Take 20 mg by mouth daily.     GLUCOSAMINE CHONDR COMPLEX PO  Take 1 tablet by mouth daily.     ibuprofen 200 MG tablet  Commonly known as:  ADVIL,MOTRIN  Take 400 mg by mouth every 6 (six) hours as needed for pain.     losartan-hydrochlorothiazide 100-12.5 MG per tablet  Commonly known as:  HYZAAR  Take 1 tablet by mouth daily.     lovastatin 10 MG tablet  Commonly known as:  MEVACOR  Take 10 mg by mouth daily.     multivitamin with minerals Tabs tablet  Take 1 tablet by mouth daily.     niacin 500 MG tablet  Take 500 mg by mouth at bedtime.     omeprazole 20 MG capsule  Commonly known as:  PRILOSEC  Take 20 mg by mouth daily.     oxyCODONE 5 MG immediate release tablet  Commonly known as:  Oxy IR/ROXICODONE  Take 1-2 tablets (5-10 mg total) by mouth every 4 (four) hours as needed for pain.     promethazine 25 MG tablet  Commonly known as:  PHENERGAN  Take 1 tablet (25 mg total) by mouth every 6 (six) hours as needed for nausea.     PSUDATABS 30 MG tablet  Generic drug:  pseudoephedrine  Take 30 mg by mouth every 4 (four) hours as needed for congestion.     zinc gluconate 50 MG tablet  Take 1 tablet (50 mg total) by mouth daily.        Diagnostic Studies: Dg Chest 2 View  12/24/2012   *RADIOLOGY REPORT*  Clinical Data:  Hypertension, asthma  CHEST - 2 VIEW  Comparison: None.  Findings: The heart and pulmonary vascularity are within normal limits.  The lungs are clear bilaterally.  No acute bony abnormality is seen.  IMPRESSION: No acute abnormality noted.   Original Report Authenticated By: Alcide Clever, M.D.   Dg Ankle Complete Right  12/25/2012   *RADIOLOGY REPORT*  Clinical Data: No fracture.  DG C-ARM 1-60 MIN,RIGHT ANKLE - COMPLETE 3+ VIEW  Comparison: Radiographs dated 12/18/2012  Findings: Multiple c-arm images demonstrate that the patient has undergone open reduction and internal fixation of the fibular shaft fracture and of the medial malleolar fracture.  On the lateral view there is a visible discontinuity of the contour of the tibial plafond indicating that the patient has a posterior malleolar fracture of the distal tibia.  IMPRESSION: Open reduction and internal fixation of distal fibula fracture and medial malleolar fracture.  The lateral C-arm image suggests that the patient has a posterior malleolar fracture of the distal tibia as well.   Original Report Authenticated By: Francene Boyers, M.D.   Dg Ankle Complete Right  12/18/2012   *RADIOLOGY REPORT*  Clinical Data: Fall, ankle pain.  RIGHT ANKLE - COMPLETE 3+ VIEW  Comparison: None.  Findings: The patient has a fracture of the distal fibula.  The most cephalad component of the fracture is through the diaphysis approximately 8 cm above the tip of the distal fibula.  A component of the fracture is also visualized proximally 2 cm above the tip of the fibula. The superior most component of the fracture demonstrates 0.3 cm lateral displacement.  The remainder of the fracture appears nondisplaced.  There is also a fracture of the medial malleolus with 0.3 cm of distraction and 0.8 cm medial displacement.  No other fracture is identified.  IMPRESSION: Distal fibular and medial malleolar fractures as described.   Original Report Authenticated By: Holley Dexter, M.D.    Dg Foot Complete Left  12/18/2012   *RADIOLOGY REPORT*  Clinical Data: Fall with pain and injury to the left foot.  LEFT FOOT - COMPLETE 3+ VIEW  Comparison: None.  Findings: No acute fracture or dislocation is identified.  There is a moderate hallux valgus deformity.  No bony lesions or erosions are identified.  Soft tissues are unremarkable.  IMPRESSION: No acute fracture identified.   Original Report  Authenticated By: Irish Lack, M.D.   Dg C-arm 1-60 Min  12/25/2012   *RADIOLOGY REPORT*  Clinical Data: No fracture.  DG C-ARM 1-60 MIN,RIGHT ANKLE - COMPLETE 3+ VIEW  Comparison: Radiographs dated 12/18/2012  Findings: Multiple c-arm images demonstrate that the patient has undergone open reduction and internal fixation of the fibular shaft fracture and of the medial malleolar fracture.  On the lateral view there is a visible discontinuity of the contour of the tibial plafond indicating that the patient has a posterior malleolar fracture of the distal tibia.  IMPRESSION: Open reduction and internal fixation of distal fibula fracture and medial malleolar fracture.  The lateral C-arm image suggests that the patient has a posterior malleolar fracture of the distal tibia as well.   Original Report Authenticated By: Francene Boyers, M.D.    Disposition: 01-Home or Self Care      Discharge Orders   Future Orders Complete By Expires   Call MD / Call 911  As directed    Comments:     If you experience chest pain or shortness of breath, CALL 911 and be transported to the hospital emergency room.  If you develope a fever above 101 F, pus (white drainage) or increased drainage or redness at the wound, or calf pain, call your surgeon's office.   Constipation Prevention  As directed    Comments:     Drink plenty of fluids.  Prune juice may be helpful.  You may use a stool softener, such as Colace (over the counter) 100 mg twice a day.  Use MiraLax (over the counter) for constipation as needed.   Driving  restrictions  As directed    Comments:     No driving for at least 9 weeks   Increase activity slowly as tolerated  As directed       Follow-up Information   Follow up with Cheral Almas, MD In 2 weeks.   Specialty:  Orthopedic Surgery   Contact information:   [address] [place] [address] [phone]       Please follow up. [facility] [phone] home physical therapy)        Signed: Cheral Almas 12/28/2012, 6:00 AM

## 2012-12-29 DIAGNOSIS — Z9181 History of falling: Secondary | ICD-10-CM | POA: Diagnosis not present

## 2012-12-29 DIAGNOSIS — Z7982 Long term (current) use of aspirin: Secondary | ICD-10-CM | POA: Diagnosis not present

## 2012-12-29 DIAGNOSIS — IMO0001 Reserved for inherently not codable concepts without codable children: Secondary | ICD-10-CM | POA: Diagnosis not present

## 2012-12-29 DIAGNOSIS — I10 Essential (primary) hypertension: Secondary | ICD-10-CM | POA: Diagnosis not present

## 2012-12-31 DIAGNOSIS — Z9181 History of falling: Secondary | ICD-10-CM | POA: Diagnosis not present

## 2012-12-31 DIAGNOSIS — Z7982 Long term (current) use of aspirin: Secondary | ICD-10-CM | POA: Diagnosis not present

## 2012-12-31 DIAGNOSIS — IMO0001 Reserved for inherently not codable concepts without codable children: Secondary | ICD-10-CM | POA: Diagnosis not present

## 2012-12-31 DIAGNOSIS — I10 Essential (primary) hypertension: Secondary | ICD-10-CM | POA: Diagnosis not present

## 2013-01-03 ENCOUNTER — Encounter (HOSPITAL_COMMUNITY): Payer: Self-pay | Admitting: Orthopaedic Surgery

## 2013-01-03 DIAGNOSIS — IMO0001 Reserved for inherently not codable concepts without codable children: Secondary | ICD-10-CM | POA: Diagnosis not present

## 2013-01-03 DIAGNOSIS — I10 Essential (primary) hypertension: Secondary | ICD-10-CM | POA: Diagnosis not present

## 2013-01-03 DIAGNOSIS — Z7982 Long term (current) use of aspirin: Secondary | ICD-10-CM | POA: Diagnosis not present

## 2013-01-03 DIAGNOSIS — Z9181 History of falling: Secondary | ICD-10-CM | POA: Diagnosis not present

## 2013-01-06 DIAGNOSIS — IMO0001 Reserved for inherently not codable concepts without codable children: Secondary | ICD-10-CM | POA: Diagnosis not present

## 2013-01-06 DIAGNOSIS — Z9181 History of falling: Secondary | ICD-10-CM | POA: Diagnosis not present

## 2013-01-06 DIAGNOSIS — Z7982 Long term (current) use of aspirin: Secondary | ICD-10-CM | POA: Diagnosis not present

## 2013-01-06 DIAGNOSIS — I10 Essential (primary) hypertension: Secondary | ICD-10-CM | POA: Diagnosis not present

## 2013-01-07 DIAGNOSIS — S82843A Displaced bimalleolar fracture of unspecified lower leg, initial encounter for closed fracture: Secondary | ICD-10-CM | POA: Diagnosis not present

## 2013-01-08 DIAGNOSIS — Z7982 Long term (current) use of aspirin: Secondary | ICD-10-CM | POA: Diagnosis not present

## 2013-01-08 DIAGNOSIS — Z9181 History of falling: Secondary | ICD-10-CM | POA: Diagnosis not present

## 2013-01-08 DIAGNOSIS — I10 Essential (primary) hypertension: Secondary | ICD-10-CM | POA: Diagnosis not present

## 2013-01-08 DIAGNOSIS — IMO0001 Reserved for inherently not codable concepts without codable children: Secondary | ICD-10-CM | POA: Diagnosis not present

## 2013-01-10 DIAGNOSIS — IMO0001 Reserved for inherently not codable concepts without codable children: Secondary | ICD-10-CM | POA: Diagnosis not present

## 2013-01-10 DIAGNOSIS — Z9181 History of falling: Secondary | ICD-10-CM | POA: Diagnosis not present

## 2013-01-10 DIAGNOSIS — I10 Essential (primary) hypertension: Secondary | ICD-10-CM | POA: Diagnosis not present

## 2013-01-10 DIAGNOSIS — Z7982 Long term (current) use of aspirin: Secondary | ICD-10-CM | POA: Diagnosis not present

## 2013-01-14 DIAGNOSIS — I10 Essential (primary) hypertension: Secondary | ICD-10-CM | POA: Diagnosis not present

## 2013-01-14 DIAGNOSIS — IMO0001 Reserved for inherently not codable concepts without codable children: Secondary | ICD-10-CM | POA: Diagnosis not present

## 2013-01-14 DIAGNOSIS — Z9181 History of falling: Secondary | ICD-10-CM | POA: Diagnosis not present

## 2013-01-14 DIAGNOSIS — Z7982 Long term (current) use of aspirin: Secondary | ICD-10-CM | POA: Diagnosis not present

## 2013-01-16 DIAGNOSIS — Z9181 History of falling: Secondary | ICD-10-CM | POA: Diagnosis not present

## 2013-01-16 DIAGNOSIS — I10 Essential (primary) hypertension: Secondary | ICD-10-CM | POA: Diagnosis not present

## 2013-01-16 DIAGNOSIS — IMO0001 Reserved for inherently not codable concepts without codable children: Secondary | ICD-10-CM | POA: Diagnosis not present

## 2013-01-16 DIAGNOSIS — Z7982 Long term (current) use of aspirin: Secondary | ICD-10-CM | POA: Diagnosis not present

## 2013-01-21 DIAGNOSIS — IMO0001 Reserved for inherently not codable concepts without codable children: Secondary | ICD-10-CM | POA: Diagnosis not present

## 2013-01-21 DIAGNOSIS — I10 Essential (primary) hypertension: Secondary | ICD-10-CM | POA: Diagnosis not present

## 2013-01-21 DIAGNOSIS — Z9181 History of falling: Secondary | ICD-10-CM | POA: Diagnosis not present

## 2013-01-21 DIAGNOSIS — Z7982 Long term (current) use of aspirin: Secondary | ICD-10-CM | POA: Diagnosis not present

## 2013-01-23 DIAGNOSIS — Z7982 Long term (current) use of aspirin: Secondary | ICD-10-CM | POA: Diagnosis not present

## 2013-01-23 DIAGNOSIS — I10 Essential (primary) hypertension: Secondary | ICD-10-CM | POA: Diagnosis not present

## 2013-01-23 DIAGNOSIS — IMO0001 Reserved for inherently not codable concepts without codable children: Secondary | ICD-10-CM | POA: Diagnosis not present

## 2013-01-23 DIAGNOSIS — Z9181 History of falling: Secondary | ICD-10-CM | POA: Diagnosis not present

## 2013-02-04 DIAGNOSIS — S82843A Displaced bimalleolar fracture of unspecified lower leg, initial encounter for closed fracture: Secondary | ICD-10-CM | POA: Diagnosis not present

## 2013-02-25 DIAGNOSIS — I10 Essential (primary) hypertension: Secondary | ICD-10-CM | POA: Diagnosis not present

## 2013-02-25 DIAGNOSIS — IMO0001 Reserved for inherently not codable concepts without codable children: Secondary | ICD-10-CM | POA: Diagnosis not present

## 2013-02-25 DIAGNOSIS — Z7982 Long term (current) use of aspirin: Secondary | ICD-10-CM | POA: Diagnosis not present

## 2013-02-25 DIAGNOSIS — Z9181 History of falling: Secondary | ICD-10-CM | POA: Diagnosis not present

## 2013-02-26 DIAGNOSIS — IMO0001 Reserved for inherently not codable concepts without codable children: Secondary | ICD-10-CM | POA: Diagnosis not present

## 2013-02-26 DIAGNOSIS — Z9181 History of falling: Secondary | ICD-10-CM | POA: Diagnosis not present

## 2013-02-26 DIAGNOSIS — I10 Essential (primary) hypertension: Secondary | ICD-10-CM | POA: Diagnosis not present

## 2013-02-26 DIAGNOSIS — Z7982 Long term (current) use of aspirin: Secondary | ICD-10-CM | POA: Diagnosis not present

## 2013-02-27 DIAGNOSIS — IMO0001 Reserved for inherently not codable concepts without codable children: Secondary | ICD-10-CM | POA: Diagnosis not present

## 2013-02-27 DIAGNOSIS — I10 Essential (primary) hypertension: Secondary | ICD-10-CM | POA: Diagnosis not present

## 2013-02-27 DIAGNOSIS — Z7982 Long term (current) use of aspirin: Secondary | ICD-10-CM | POA: Diagnosis not present

## 2013-02-27 DIAGNOSIS — Z9181 History of falling: Secondary | ICD-10-CM | POA: Diagnosis not present

## 2013-02-28 DIAGNOSIS — Z7982 Long term (current) use of aspirin: Secondary | ICD-10-CM | POA: Diagnosis not present

## 2013-02-28 DIAGNOSIS — IMO0001 Reserved for inherently not codable concepts without codable children: Secondary | ICD-10-CM | POA: Diagnosis not present

## 2013-02-28 DIAGNOSIS — Z9181 History of falling: Secondary | ICD-10-CM | POA: Diagnosis not present

## 2013-02-28 DIAGNOSIS — I10 Essential (primary) hypertension: Secondary | ICD-10-CM | POA: Diagnosis not present

## 2013-03-03 DIAGNOSIS — I10 Essential (primary) hypertension: Secondary | ICD-10-CM | POA: Diagnosis not present

## 2013-03-03 DIAGNOSIS — Z7982 Long term (current) use of aspirin: Secondary | ICD-10-CM | POA: Diagnosis not present

## 2013-03-03 DIAGNOSIS — Z9181 History of falling: Secondary | ICD-10-CM | POA: Diagnosis not present

## 2013-03-03 DIAGNOSIS — IMO0001 Reserved for inherently not codable concepts without codable children: Secondary | ICD-10-CM | POA: Diagnosis not present

## 2013-03-07 DIAGNOSIS — IMO0001 Reserved for inherently not codable concepts without codable children: Secondary | ICD-10-CM | POA: Diagnosis not present

## 2013-03-07 DIAGNOSIS — I10 Essential (primary) hypertension: Secondary | ICD-10-CM | POA: Diagnosis not present

## 2013-03-07 DIAGNOSIS — Z9181 History of falling: Secondary | ICD-10-CM | POA: Diagnosis not present

## 2013-03-07 DIAGNOSIS — Z7982 Long term (current) use of aspirin: Secondary | ICD-10-CM | POA: Diagnosis not present

## 2013-03-11 DIAGNOSIS — I10 Essential (primary) hypertension: Secondary | ICD-10-CM | POA: Diagnosis not present

## 2013-03-11 DIAGNOSIS — Z9181 History of falling: Secondary | ICD-10-CM | POA: Diagnosis not present

## 2013-03-11 DIAGNOSIS — Z7982 Long term (current) use of aspirin: Secondary | ICD-10-CM | POA: Diagnosis not present

## 2013-03-11 DIAGNOSIS — IMO0001 Reserved for inherently not codable concepts without codable children: Secondary | ICD-10-CM | POA: Diagnosis not present

## 2013-03-15 DIAGNOSIS — Z9181 History of falling: Secondary | ICD-10-CM | POA: Diagnosis not present

## 2013-03-15 DIAGNOSIS — Z7982 Long term (current) use of aspirin: Secondary | ICD-10-CM | POA: Diagnosis not present

## 2013-03-15 DIAGNOSIS — IMO0001 Reserved for inherently not codable concepts without codable children: Secondary | ICD-10-CM | POA: Diagnosis not present

## 2013-03-15 DIAGNOSIS — I10 Essential (primary) hypertension: Secondary | ICD-10-CM | POA: Diagnosis not present

## 2013-03-18 DIAGNOSIS — S82843A Displaced bimalleolar fracture of unspecified lower leg, initial encounter for closed fracture: Secondary | ICD-10-CM | POA: Diagnosis not present

## 2013-03-28 DIAGNOSIS — R262 Difficulty in walking, not elsewhere classified: Secondary | ICD-10-CM | POA: Diagnosis not present

## 2013-03-28 DIAGNOSIS — S82899A Other fracture of unspecified lower leg, initial encounter for closed fracture: Secondary | ICD-10-CM | POA: Diagnosis not present

## 2013-03-28 DIAGNOSIS — M25669 Stiffness of unspecified knee, not elsewhere classified: Secondary | ICD-10-CM | POA: Diagnosis not present

## 2013-04-01 DIAGNOSIS — S82899A Other fracture of unspecified lower leg, initial encounter for closed fracture: Secondary | ICD-10-CM | POA: Diagnosis not present

## 2013-04-01 DIAGNOSIS — R262 Difficulty in walking, not elsewhere classified: Secondary | ICD-10-CM | POA: Diagnosis not present

## 2013-04-01 DIAGNOSIS — M25669 Stiffness of unspecified knee, not elsewhere classified: Secondary | ICD-10-CM | POA: Diagnosis not present

## 2013-04-04 DIAGNOSIS — S82899A Other fracture of unspecified lower leg, initial encounter for closed fracture: Secondary | ICD-10-CM | POA: Diagnosis not present

## 2013-04-04 DIAGNOSIS — R262 Difficulty in walking, not elsewhere classified: Secondary | ICD-10-CM | POA: Diagnosis not present

## 2013-04-04 DIAGNOSIS — M25669 Stiffness of unspecified knee, not elsewhere classified: Secondary | ICD-10-CM | POA: Diagnosis not present

## 2013-04-07 DIAGNOSIS — S82899A Other fracture of unspecified lower leg, initial encounter for closed fracture: Secondary | ICD-10-CM | POA: Diagnosis not present

## 2013-04-07 DIAGNOSIS — M25669 Stiffness of unspecified knee, not elsewhere classified: Secondary | ICD-10-CM | POA: Diagnosis not present

## 2013-04-07 DIAGNOSIS — R262 Difficulty in walking, not elsewhere classified: Secondary | ICD-10-CM | POA: Diagnosis not present

## 2013-04-09 DIAGNOSIS — R262 Difficulty in walking, not elsewhere classified: Secondary | ICD-10-CM | POA: Diagnosis not present

## 2013-04-09 DIAGNOSIS — S82899A Other fracture of unspecified lower leg, initial encounter for closed fracture: Secondary | ICD-10-CM | POA: Diagnosis not present

## 2013-04-09 DIAGNOSIS — M25669 Stiffness of unspecified knee, not elsewhere classified: Secondary | ICD-10-CM | POA: Diagnosis not present

## 2013-04-11 DIAGNOSIS — S82899A Other fracture of unspecified lower leg, initial encounter for closed fracture: Secondary | ICD-10-CM | POA: Diagnosis not present

## 2013-04-11 DIAGNOSIS — M25669 Stiffness of unspecified knee, not elsewhere classified: Secondary | ICD-10-CM | POA: Diagnosis not present

## 2013-04-11 DIAGNOSIS — R262 Difficulty in walking, not elsewhere classified: Secondary | ICD-10-CM | POA: Diagnosis not present

## 2013-04-15 DIAGNOSIS — M25669 Stiffness of unspecified knee, not elsewhere classified: Secondary | ICD-10-CM | POA: Diagnosis not present

## 2013-04-15 DIAGNOSIS — S82899A Other fracture of unspecified lower leg, initial encounter for closed fracture: Secondary | ICD-10-CM | POA: Diagnosis not present

## 2013-04-15 DIAGNOSIS — R262 Difficulty in walking, not elsewhere classified: Secondary | ICD-10-CM | POA: Diagnosis not present

## 2013-04-22 DIAGNOSIS — M25669 Stiffness of unspecified knee, not elsewhere classified: Secondary | ICD-10-CM | POA: Diagnosis not present

## 2013-04-22 DIAGNOSIS — S82899A Other fracture of unspecified lower leg, initial encounter for closed fracture: Secondary | ICD-10-CM | POA: Diagnosis not present

## 2013-04-22 DIAGNOSIS — R262 Difficulty in walking, not elsewhere classified: Secondary | ICD-10-CM | POA: Diagnosis not present

## 2013-04-29 DIAGNOSIS — B9789 Other viral agents as the cause of diseases classified elsewhere: Secondary | ICD-10-CM | POA: Diagnosis not present

## 2013-04-29 DIAGNOSIS — Z79899 Other long term (current) drug therapy: Secondary | ICD-10-CM | POA: Diagnosis not present

## 2013-04-29 DIAGNOSIS — Z23 Encounter for immunization: Secondary | ICD-10-CM | POA: Diagnosis not present

## 2013-04-29 DIAGNOSIS — Z1331 Encounter for screening for depression: Secondary | ICD-10-CM | POA: Diagnosis not present

## 2013-04-29 DIAGNOSIS — M949 Disorder of cartilage, unspecified: Secondary | ICD-10-CM | POA: Diagnosis not present

## 2013-04-29 DIAGNOSIS — R262 Difficulty in walking, not elsewhere classified: Secondary | ICD-10-CM | POA: Diagnosis not present

## 2013-04-29 DIAGNOSIS — I10 Essential (primary) hypertension: Secondary | ICD-10-CM | POA: Diagnosis not present

## 2013-04-29 DIAGNOSIS — M899 Disorder of bone, unspecified: Secondary | ICD-10-CM | POA: Diagnosis not present

## 2013-04-29 DIAGNOSIS — K219 Gastro-esophageal reflux disease without esophagitis: Secondary | ICD-10-CM | POA: Diagnosis not present

## 2013-04-29 DIAGNOSIS — S82899A Other fracture of unspecified lower leg, initial encounter for closed fracture: Secondary | ICD-10-CM | POA: Diagnosis not present

## 2013-04-29 DIAGNOSIS — M25669 Stiffness of unspecified knee, not elsewhere classified: Secondary | ICD-10-CM | POA: Diagnosis not present

## 2013-04-29 DIAGNOSIS — E7800 Pure hypercholesterolemia, unspecified: Secondary | ICD-10-CM | POA: Diagnosis not present

## 2013-05-19 DIAGNOSIS — M899 Disorder of bone, unspecified: Secondary | ICD-10-CM | POA: Diagnosis not present

## 2013-05-28 ENCOUNTER — Other Ambulatory Visit: Payer: Self-pay

## 2013-05-28 DIAGNOSIS — Z1231 Encounter for screening mammogram for malignant neoplasm of breast: Secondary | ICD-10-CM

## 2013-06-12 ENCOUNTER — Ambulatory Visit: Payer: Medicare Other

## 2013-06-26 DIAGNOSIS — S82843A Displaced bimalleolar fracture of unspecified lower leg, initial encounter for closed fracture: Secondary | ICD-10-CM | POA: Diagnosis not present

## 2013-10-28 DIAGNOSIS — E7800 Pure hypercholesterolemia, unspecified: Secondary | ICD-10-CM | POA: Diagnosis not present

## 2013-10-28 DIAGNOSIS — K219 Gastro-esophageal reflux disease without esophagitis: Secondary | ICD-10-CM | POA: Diagnosis not present

## 2013-10-28 DIAGNOSIS — J45909 Unspecified asthma, uncomplicated: Secondary | ICD-10-CM | POA: Diagnosis not present

## 2013-10-28 DIAGNOSIS — I10 Essential (primary) hypertension: Secondary | ICD-10-CM | POA: Diagnosis not present

## 2013-10-28 DIAGNOSIS — M899 Disorder of bone, unspecified: Secondary | ICD-10-CM | POA: Diagnosis not present

## 2013-10-28 DIAGNOSIS — M949 Disorder of cartilage, unspecified: Secondary | ICD-10-CM | POA: Diagnosis not present

## 2013-11-06 ENCOUNTER — Ambulatory Visit
Admission: RE | Admit: 2013-11-06 | Discharge: 2013-11-06 | Disposition: A | Discharge status: Home / Self Care | Payer: Medicare Other | Source: Ambulatory Visit

## 2013-11-06 DIAGNOSIS — Z1231 Encounter for screening mammogram for malignant neoplasm of breast: Secondary | ICD-10-CM

## 2013-12-04 DIAGNOSIS — Z961 Presence of intraocular lens: Secondary | ICD-10-CM | POA: Diagnosis not present

## 2013-12-25 DIAGNOSIS — S82843A Displaced bimalleolar fracture of unspecified lower leg, initial encounter for closed fracture: Secondary | ICD-10-CM | POA: Diagnosis not present

## 2013-12-25 DIAGNOSIS — M76899 Other specified enthesopathies of unspecified lower limb, excluding foot: Secondary | ICD-10-CM | POA: Diagnosis not present

## 2014-01-29 DIAGNOSIS — Z23 Encounter for immunization: Secondary | ICD-10-CM | POA: Diagnosis not present

## 2014-04-30 DIAGNOSIS — I10 Essential (primary) hypertension: Secondary | ICD-10-CM | POA: Diagnosis not present

## 2014-04-30 DIAGNOSIS — J069 Acute upper respiratory infection, unspecified: Secondary | ICD-10-CM | POA: Diagnosis not present

## 2014-04-30 DIAGNOSIS — Z1389 Encounter for screening for other disorder: Secondary | ICD-10-CM | POA: Diagnosis not present

## 2014-04-30 DIAGNOSIS — M899 Disorder of bone, unspecified: Secondary | ICD-10-CM | POA: Diagnosis not present

## 2014-04-30 DIAGNOSIS — K219 Gastro-esophageal reflux disease without esophagitis: Secondary | ICD-10-CM | POA: Diagnosis not present

## 2014-04-30 DIAGNOSIS — E780 Pure hypercholesterolemia: Secondary | ICD-10-CM | POA: Diagnosis not present

## 2014-05-06 DIAGNOSIS — R05 Cough: Secondary | ICD-10-CM | POA: Diagnosis not present

## 2014-05-06 DIAGNOSIS — J209 Acute bronchitis, unspecified: Secondary | ICD-10-CM | POA: Diagnosis not present

## 2014-05-06 DIAGNOSIS — J9801 Acute bronchospasm: Secondary | ICD-10-CM | POA: Diagnosis not present

## 2014-11-04 ENCOUNTER — Other Ambulatory Visit: Payer: Self-pay

## 2014-11-04 DIAGNOSIS — Z803 Family history of malignant neoplasm of breast: Secondary | ICD-10-CM

## 2014-11-04 DIAGNOSIS — Z1231 Encounter for screening mammogram for malignant neoplasm of breast: Secondary | ICD-10-CM

## 2014-11-06 ENCOUNTER — Other Ambulatory Visit: Payer: Self-pay | Admitting: Family Medicine

## 2014-11-06 DIAGNOSIS — R413 Other amnesia: Secondary | ICD-10-CM

## 2014-11-06 DIAGNOSIS — Z23 Encounter for immunization: Secondary | ICD-10-CM | POA: Diagnosis not present

## 2014-11-06 DIAGNOSIS — R29818 Other symptoms and signs involving the nervous system: Secondary | ICD-10-CM

## 2014-11-06 DIAGNOSIS — K219 Gastro-esophageal reflux disease without esophagitis: Secondary | ICD-10-CM | POA: Diagnosis not present

## 2014-11-06 DIAGNOSIS — I10 Essential (primary) hypertension: Secondary | ICD-10-CM | POA: Diagnosis not present

## 2014-11-06 DIAGNOSIS — E780 Pure hypercholesterolemia: Secondary | ICD-10-CM | POA: Diagnosis not present

## 2014-11-06 DIAGNOSIS — M899 Disorder of bone, unspecified: Secondary | ICD-10-CM | POA: Diagnosis not present

## 2014-11-07 ENCOUNTER — Ambulatory Visit
Admission: RE | Admit: 2014-11-07 | Discharge: 2014-11-07 | Disposition: A | Discharge status: Home / Self Care | Payer: Medicare Other | Source: Ambulatory Visit | Attending: Family Medicine | Admitting: Family Medicine

## 2014-11-07 DIAGNOSIS — R413 Other amnesia: Secondary | ICD-10-CM | POA: Diagnosis not present

## 2014-11-07 DIAGNOSIS — R29818 Other symptoms and signs involving the nervous system: Secondary | ICD-10-CM

## 2014-12-08 ENCOUNTER — Ambulatory Visit
Admission: RE | Admit: 2014-12-08 | Discharge: 2014-12-08 | Disposition: A | Discharge status: Home / Self Care | Payer: Medicare Other | Source: Ambulatory Visit

## 2014-12-08 DIAGNOSIS — Z803 Family history of malignant neoplasm of breast: Secondary | ICD-10-CM

## 2014-12-08 DIAGNOSIS — Z1231 Encounter for screening mammogram for malignant neoplasm of breast: Secondary | ICD-10-CM

## 2015-01-07 DIAGNOSIS — Z961 Presence of intraocular lens: Secondary | ICD-10-CM | POA: Diagnosis not present

## 2015-01-07 DIAGNOSIS — H04123 Dry eye syndrome of bilateral lacrimal glands: Secondary | ICD-10-CM | POA: Diagnosis not present

## 2015-01-14 DIAGNOSIS — Z23 Encounter for immunization: Secondary | ICD-10-CM | POA: Diagnosis not present

## 2015-05-13 DIAGNOSIS — Z1389 Encounter for screening for other disorder: Secondary | ICD-10-CM | POA: Diagnosis not present

## 2015-05-13 DIAGNOSIS — I10 Essential (primary) hypertension: Secondary | ICD-10-CM | POA: Diagnosis not present

## 2015-05-13 DIAGNOSIS — K219 Gastro-esophageal reflux disease without esophagitis: Secondary | ICD-10-CM | POA: Diagnosis not present

## 2015-05-13 DIAGNOSIS — M899 Disorder of bone, unspecified: Secondary | ICD-10-CM | POA: Diagnosis not present

## 2015-05-13 DIAGNOSIS — E7800 Pure hypercholesterolemia, unspecified: Secondary | ICD-10-CM | POA: Diagnosis not present

## 2015-06-17 DIAGNOSIS — M859 Disorder of bone density and structure, unspecified: Secondary | ICD-10-CM | POA: Diagnosis not present

## 2015-06-17 DIAGNOSIS — M8589 Other specified disorders of bone density and structure, multiple sites: Secondary | ICD-10-CM | POA: Diagnosis not present

## 2015-11-09 ENCOUNTER — Other Ambulatory Visit: Payer: Self-pay | Admitting: Family Medicine

## 2015-11-09 DIAGNOSIS — Z1231 Encounter for screening mammogram for malignant neoplasm of breast: Secondary | ICD-10-CM

## 2015-11-23 DIAGNOSIS — I10 Essential (primary) hypertension: Secondary | ICD-10-CM | POA: Diagnosis not present

## 2015-11-23 DIAGNOSIS — R7301 Impaired fasting glucose: Secondary | ICD-10-CM | POA: Diagnosis not present

## 2015-11-23 DIAGNOSIS — Z0000 Encounter for general adult medical examination without abnormal findings: Secondary | ICD-10-CM | POA: Diagnosis not present

## 2015-11-23 DIAGNOSIS — E7800 Pure hypercholesterolemia, unspecified: Secondary | ICD-10-CM | POA: Diagnosis not present

## 2015-11-23 DIAGNOSIS — Z1211 Encounter for screening for malignant neoplasm of colon: Secondary | ICD-10-CM | POA: Diagnosis not present

## 2015-11-23 DIAGNOSIS — K219 Gastro-esophageal reflux disease without esophagitis: Secondary | ICD-10-CM | POA: Diagnosis not present

## 2015-11-23 DIAGNOSIS — M899 Disorder of bone, unspecified: Secondary | ICD-10-CM | POA: Diagnosis not present

## 2015-11-23 DIAGNOSIS — E669 Obesity, unspecified: Secondary | ICD-10-CM | POA: Diagnosis not present

## 2015-11-23 DIAGNOSIS — J45909 Unspecified asthma, uncomplicated: Secondary | ICD-10-CM | POA: Diagnosis not present

## 2015-12-13 ENCOUNTER — Ambulatory Visit
Admission: RE | Admit: 2015-12-13 | Discharge: 2015-12-13 | Disposition: A | Discharge status: Home / Self Care | Payer: Medicare Other | Source: Ambulatory Visit | Attending: Family Medicine | Admitting: Family Medicine

## 2015-12-13 DIAGNOSIS — Z1231 Encounter for screening mammogram for malignant neoplasm of breast: Secondary | ICD-10-CM | POA: Diagnosis not present

## 2016-01-14 DIAGNOSIS — K219 Gastro-esophageal reflux disease without esophagitis: Secondary | ICD-10-CM | POA: Diagnosis not present

## 2016-01-14 DIAGNOSIS — R05 Cough: Secondary | ICD-10-CM | POA: Diagnosis not present

## 2016-01-14 DIAGNOSIS — R112 Nausea with vomiting, unspecified: Secondary | ICD-10-CM | POA: Diagnosis not present

## 2016-01-14 DIAGNOSIS — Z23 Encounter for immunization: Secondary | ICD-10-CM | POA: Diagnosis not present

## 2016-01-19 DIAGNOSIS — R112 Nausea with vomiting, unspecified: Secondary | ICD-10-CM | POA: Diagnosis not present

## 2016-01-19 DIAGNOSIS — R0789 Other chest pain: Secondary | ICD-10-CM | POA: Diagnosis not present

## 2016-01-19 DIAGNOSIS — R1011 Right upper quadrant pain: Secondary | ICD-10-CM | POA: Diagnosis not present

## 2016-01-26 DIAGNOSIS — K449 Diaphragmatic hernia without obstruction or gangrene: Secondary | ICD-10-CM | POA: Diagnosis not present

## 2016-01-26 DIAGNOSIS — R1011 Right upper quadrant pain: Secondary | ICD-10-CM | POA: Diagnosis not present

## 2016-01-26 DIAGNOSIS — Z1211 Encounter for screening for malignant neoplasm of colon: Secondary | ICD-10-CM | POA: Diagnosis not present

## 2016-01-28 ENCOUNTER — Other Ambulatory Visit: Payer: Self-pay | Admitting: Gastroenterology

## 2016-01-28 DIAGNOSIS — R1084 Generalized abdominal pain: Secondary | ICD-10-CM

## 2016-02-08 ENCOUNTER — Ambulatory Visit
Admission: RE | Admit: 2016-02-08 | Discharge: 2016-02-08 | Disposition: A | Discharge status: Home / Self Care | Payer: Medicare Other | Source: Ambulatory Visit | Attending: Gastroenterology | Admitting: Gastroenterology

## 2016-02-08 DIAGNOSIS — R1013 Epigastric pain: Secondary | ICD-10-CM | POA: Diagnosis not present

## 2016-02-08 DIAGNOSIS — R935 Abnormal findings on diagnostic imaging of other abdominal regions, including retroperitoneum: Secondary | ICD-10-CM | POA: Diagnosis not present

## 2016-02-08 DIAGNOSIS — R1084 Generalized abdominal pain: Secondary | ICD-10-CM

## 2016-02-08 MED ORDER — GADOBENATE DIMEGLUMINE 529 MG/ML IV SOLN
17.0000 mL | Freq: Once | INTRAVENOUS | Status: AC | PRN
  Administered 2016-02-08: 17 mL via INTRAVENOUS

## 2016-02-29 DIAGNOSIS — K219 Gastro-esophageal reflux disease without esophagitis: Secondary | ICD-10-CM | POA: Diagnosis not present

## 2016-02-29 DIAGNOSIS — R112 Nausea with vomiting, unspecified: Secondary | ICD-10-CM | POA: Diagnosis not present

## 2016-04-05 DIAGNOSIS — R112 Nausea with vomiting, unspecified: Secondary | ICD-10-CM | POA: Diagnosis not present

## 2016-04-05 DIAGNOSIS — K219 Gastro-esophageal reflux disease without esophagitis: Secondary | ICD-10-CM | POA: Diagnosis not present

## 2016-05-29 DIAGNOSIS — E669 Obesity, unspecified: Secondary | ICD-10-CM | POA: Diagnosis not present

## 2016-05-29 DIAGNOSIS — J45909 Unspecified asthma, uncomplicated: Secondary | ICD-10-CM | POA: Diagnosis not present

## 2016-05-29 DIAGNOSIS — K219 Gastro-esophageal reflux disease without esophagitis: Secondary | ICD-10-CM | POA: Diagnosis not present

## 2016-05-29 DIAGNOSIS — R7301 Impaired fasting glucose: Secondary | ICD-10-CM | POA: Diagnosis not present

## 2016-05-29 DIAGNOSIS — I10 Essential (primary) hypertension: Secondary | ICD-10-CM | POA: Diagnosis not present

## 2016-05-29 DIAGNOSIS — M899 Disorder of bone, unspecified: Secondary | ICD-10-CM | POA: Diagnosis not present

## 2016-05-29 DIAGNOSIS — E7800 Pure hypercholesterolemia, unspecified: Secondary | ICD-10-CM | POA: Diagnosis not present

## 2016-05-29 DIAGNOSIS — R609 Edema, unspecified: Secondary | ICD-10-CM | POA: Diagnosis not present

## 2016-11-07 DIAGNOSIS — K219 Gastro-esophageal reflux disease without esophagitis: Secondary | ICD-10-CM | POA: Diagnosis not present

## 2016-11-22 ENCOUNTER — Other Ambulatory Visit: Payer: Self-pay | Admitting: Family Medicine

## 2016-11-22 DIAGNOSIS — Z1231 Encounter for screening mammogram for malignant neoplasm of breast: Secondary | ICD-10-CM

## 2016-12-06 DIAGNOSIS — R609 Edema, unspecified: Secondary | ICD-10-CM | POA: Diagnosis not present

## 2016-12-06 DIAGNOSIS — E7800 Pure hypercholesterolemia, unspecified: Secondary | ICD-10-CM | POA: Diagnosis not present

## 2016-12-06 DIAGNOSIS — J45909 Unspecified asthma, uncomplicated: Secondary | ICD-10-CM | POA: Diagnosis not present

## 2016-12-06 DIAGNOSIS — Z0000 Encounter for general adult medical examination without abnormal findings: Secondary | ICD-10-CM | POA: Diagnosis not present

## 2016-12-06 DIAGNOSIS — K219 Gastro-esophageal reflux disease without esophagitis: Secondary | ICD-10-CM | POA: Diagnosis not present

## 2016-12-06 DIAGNOSIS — Z1389 Encounter for screening for other disorder: Secondary | ICD-10-CM | POA: Diagnosis not present

## 2016-12-06 DIAGNOSIS — J029 Acute pharyngitis, unspecified: Secondary | ICD-10-CM | POA: Diagnosis not present

## 2016-12-06 DIAGNOSIS — I10 Essential (primary) hypertension: Secondary | ICD-10-CM | POA: Diagnosis not present

## 2016-12-06 DIAGNOSIS — R7301 Impaired fasting glucose: Secondary | ICD-10-CM | POA: Diagnosis not present

## 2016-12-06 DIAGNOSIS — M899 Disorder of bone, unspecified: Secondary | ICD-10-CM | POA: Diagnosis not present

## 2016-12-15 ENCOUNTER — Ambulatory Visit
Admission: RE | Admit: 2016-12-15 | Discharge: 2016-12-15 | Disposition: A | Discharge status: Home / Self Care | Payer: Medicare Other | Source: Ambulatory Visit | Attending: Family Medicine | Admitting: Family Medicine

## 2016-12-15 DIAGNOSIS — Z1231 Encounter for screening mammogram for malignant neoplasm of breast: Secondary | ICD-10-CM | POA: Diagnosis not present

## 2017-02-08 DIAGNOSIS — R05 Cough: Secondary | ICD-10-CM | POA: Diagnosis not present

## 2017-02-08 DIAGNOSIS — J9801 Acute bronchospasm: Secondary | ICD-10-CM | POA: Diagnosis not present

## 2017-02-15 DIAGNOSIS — Z23 Encounter for immunization: Secondary | ICD-10-CM | POA: Diagnosis not present

## 2017-05-02 IMAGING — MR MR HEAD W/O CM
9 of 10 series · 42 of 48 positions shown · non-contrast
Comparison: None.

CLINICAL DATA: 74-year-old female with memory loss since [REDACTED].
Initial encounter.

EXAM:
MRI HEAD WITHOUT CONTRAST
TECHNIQUE: Multiplanar, multiecho pulse sequences of the brain and surrounding
structures were obtained without intravenous contrast.

[Series 2: T1 · sagittal · 5.0mm · 0.45mm/px · 3 of 19 slices shown (1 of 2)]
[im 1/19]
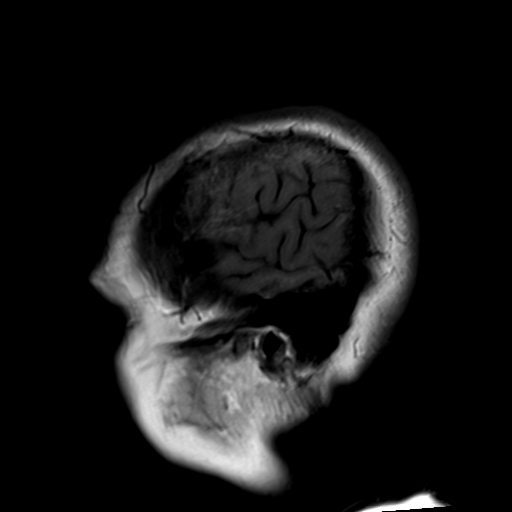
[im 10/19]
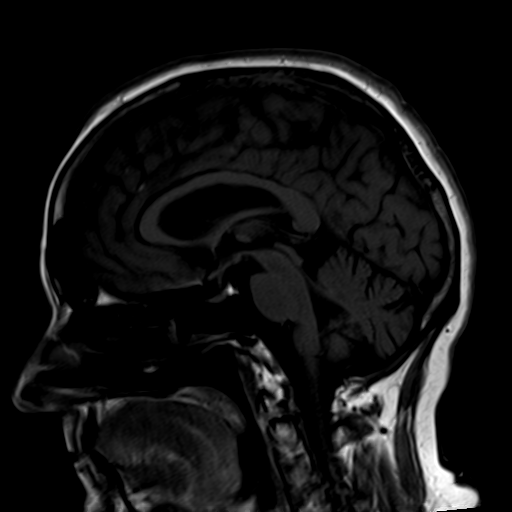
[im 19/19]
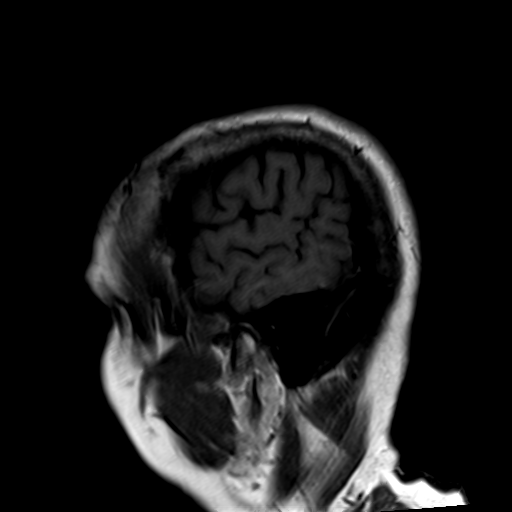

[Series 3: ep2d_diff_(id)_trace · axial · 3.0mm · 1.80mm/px · z∈[-84,+60]mm · 8 of 100 slices shown]
[im 1/100]
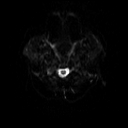
[im 12/100]
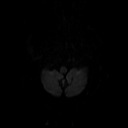
[im 34/100]
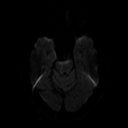
[im 45/100]
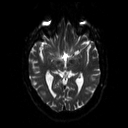
[im 56/100]
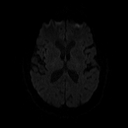
[im 67/100]
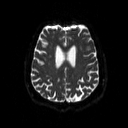
[im 89/100]
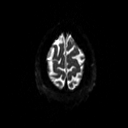
[im 100/100]
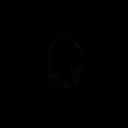

[Series 4: ep2d_diff_(id)_trace_adc · axial · 3.0mm · 1.80mm/px · z∈[-84,+60]mm · 5 of 45 slices shown]
[im 1/45]
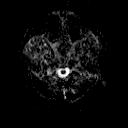
[im 12/45]
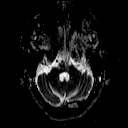
[im 23/45]
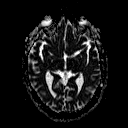
[im 34/45]
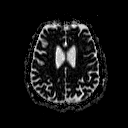
[im 45/45]
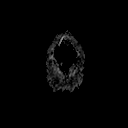

[Series 6: swi_images · axial · 2.0mm · 0.90mm/px · z∈[-82,+58]mm · 7 of 72 slices shown]
[im 1/72]
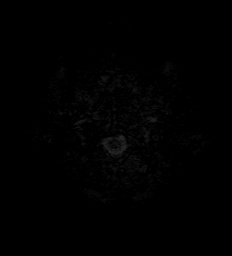
[im 12/72]
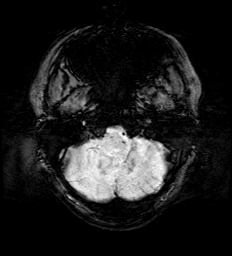
[im 24/72]
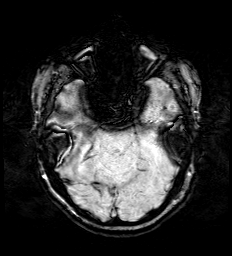
[im 36/72]
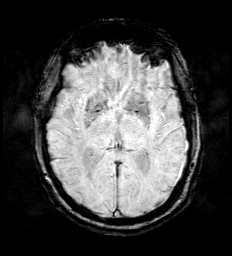
[im 48/72]
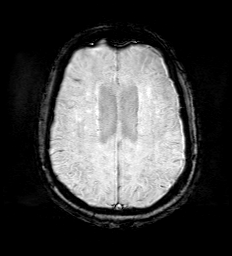
[im 60/72]
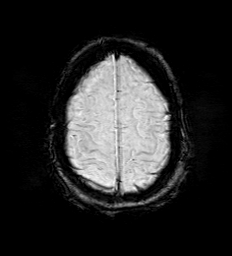
[im 72/72]
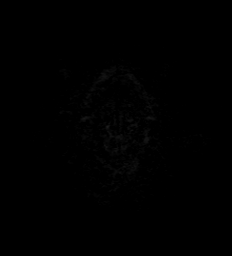

[Series 7: FLAIR · axial · 5.0mm · 0.90mm/px · z∈[-82,+58]mm · 2 of 23 slices shown]
[im 1/23]
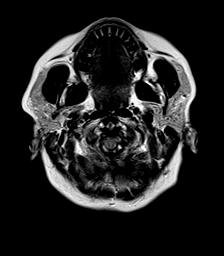
[im 23/23]
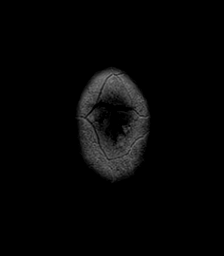

[Series 8: T2 · axial · 5.0mm · 0.30mm/px · z∈[-82,+58]mm · 2 of 23 slices shown (1 of 2)]
[im 1/23]
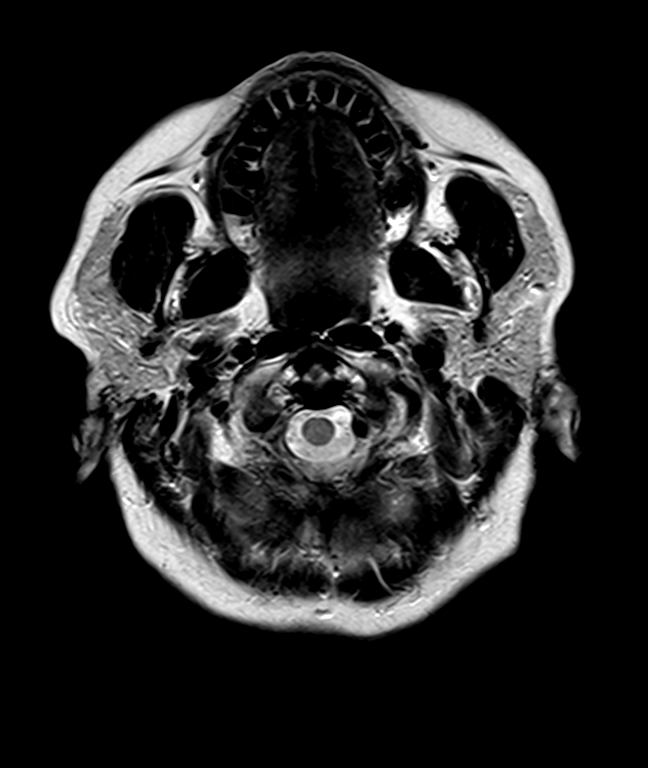
[im 23/23]
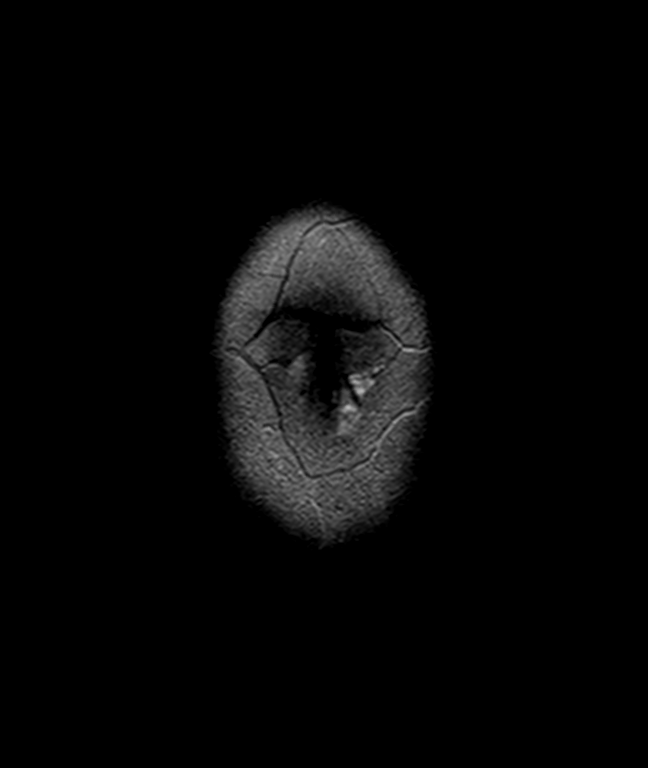

[Series 9: T1 · axial · 2.0mm · 0.45mm/px · z∈[-82,+58]mm · 7 of 72 slices shown (2 of 2)]
[im 1/72]
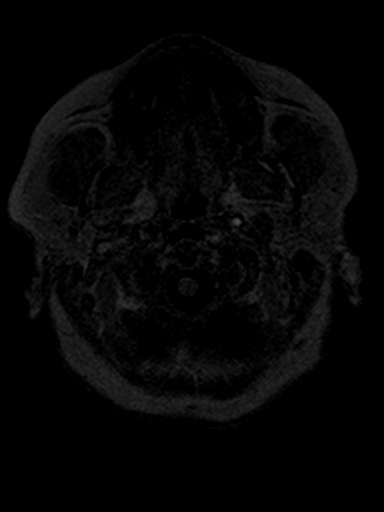
[im 12/72]
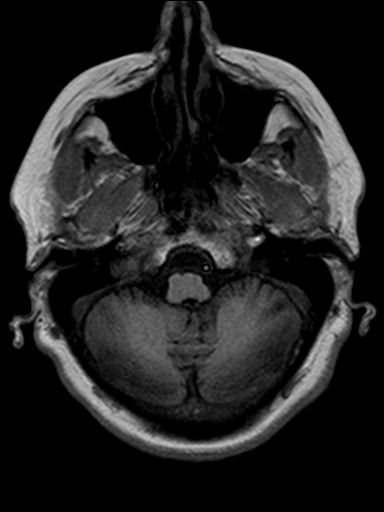
[im 24/72]
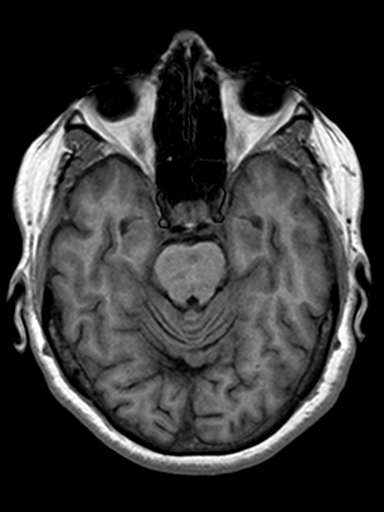
[im 36/72]
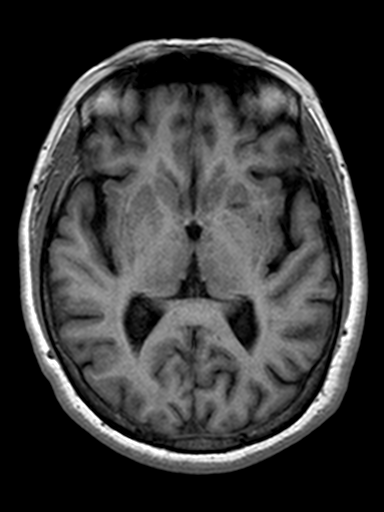
[im 48/72]
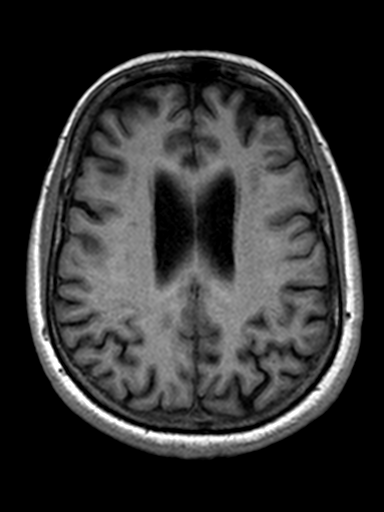
[im 60/72]
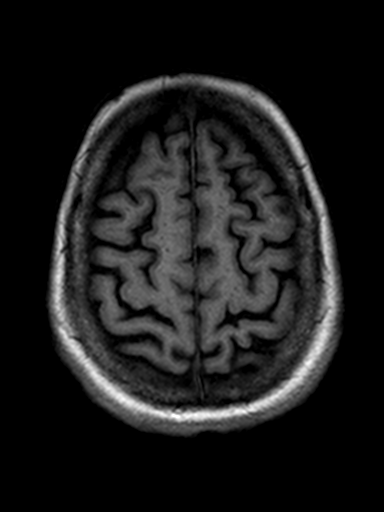
[im 72/72]
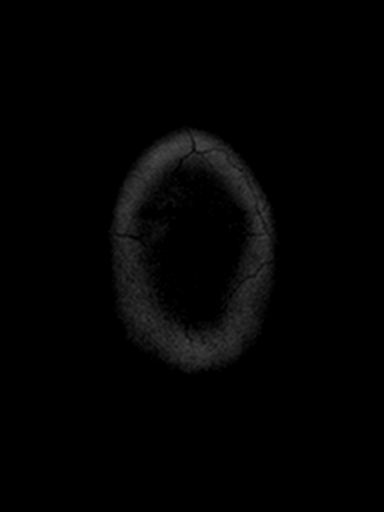

[Series 10: T2 · coronal · 5.0mm · 0.45mm/px · 2 of 23 slices shown (2 of 2)]
[im 1/23]
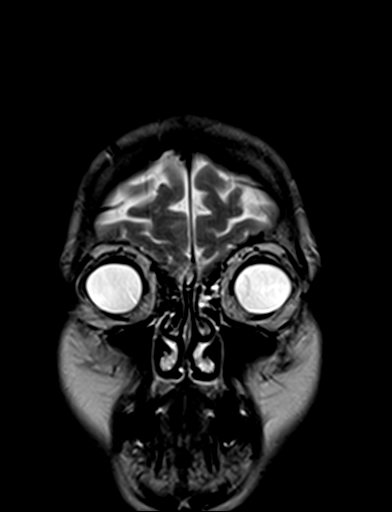
[im 23/23]
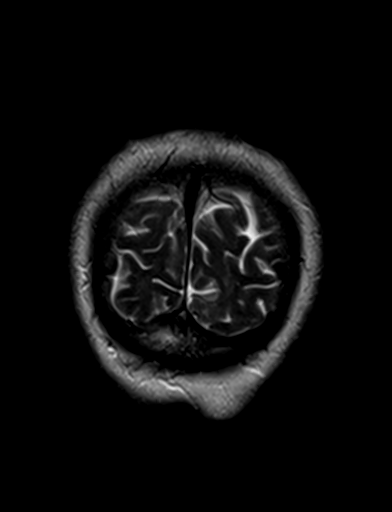

[Series 11: ep2d_diff cor for · coronal · 5.0mm · 0.90mm/px · 6 of 66 slices shown]
[im 1/66]
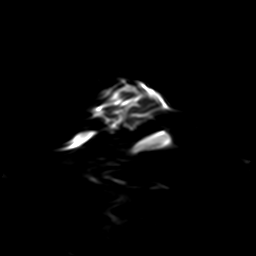
[im 11/66]
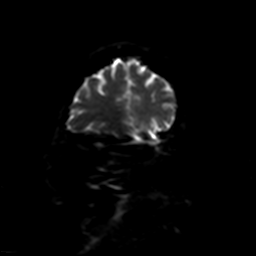
[im 22/66]
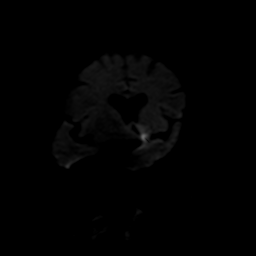
[im 33/66]
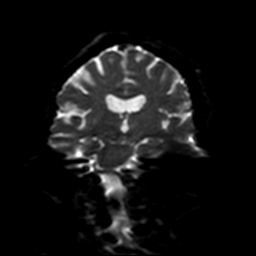
[im 44/66]
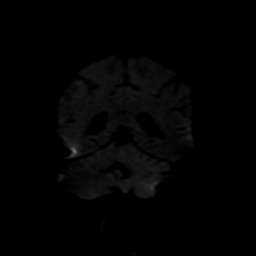
[im 55/66]
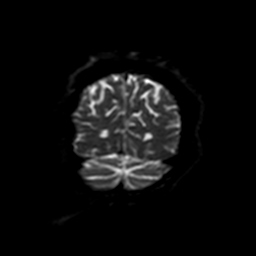

[42 of 48 positions shown; findings below may reference images not displayed]

FINDINGS: Cerebral volume is within normal limits for age. No restricted
diffusion to suggest acute infarction. No midline shift, mass
effect, evidence of mass lesion, ventriculomegaly, extra-axial
collection or acute intracranial hemorrhage. Cervicomedullary
junction and pituitary are within normal limits. Major intracranial
vascular flow voids are preserved, the distal left vertebral artery
appears dominant and is dolichoectasia attic.

Mild for age scattered nonspecific cerebral white matter T2 and
FLAIR hyperintensity. Similar mild T2 heterogeneity in the pons. No
cortical encephalomalacia. No chronic cerebral blood products are
identified. Mesial temporal lobe structures appear symmetric and
normal for age.

Visible internal auditory structures appear normal. Mastoids are
clear. Mild to moderate left sphenoid sinus mucosal thickening. Mild
left ethmoid air cell mucosal thickening. Other paranasal sinuses
are clear. Orbits soft tissues appear normal, postoperative changes
to the globes. Negative scalp soft tissues. Normal bone marrow
signal. Negative visualized cervical spine.
IMPRESSION: 1.  No acute intracranial abnormality.
2. Mild for age nonspecific signal changes in the cerebral white
matter and pons, most commonly due to chronic small vessel disease.
Mild intracranial artery dolichoectasia.
3. Mild paranasal sinus inflammation.

## 2017-06-08 DIAGNOSIS — I10 Essential (primary) hypertension: Secondary | ICD-10-CM | POA: Diagnosis not present

## 2017-06-08 DIAGNOSIS — L821 Other seborrheic keratosis: Secondary | ICD-10-CM | POA: Diagnosis not present

## 2017-06-08 DIAGNOSIS — R609 Edema, unspecified: Secondary | ICD-10-CM | POA: Diagnosis not present

## 2017-06-08 DIAGNOSIS — K219 Gastro-esophageal reflux disease without esophagitis: Secondary | ICD-10-CM | POA: Diagnosis not present

## 2017-06-08 DIAGNOSIS — J45909 Unspecified asthma, uncomplicated: Secondary | ICD-10-CM | POA: Diagnosis not present

## 2017-06-08 DIAGNOSIS — R7301 Impaired fasting glucose: Secondary | ICD-10-CM | POA: Diagnosis not present

## 2017-06-08 DIAGNOSIS — E7800 Pure hypercholesterolemia, unspecified: Secondary | ICD-10-CM | POA: Diagnosis not present

## 2017-06-08 DIAGNOSIS — M899 Disorder of bone, unspecified: Secondary | ICD-10-CM | POA: Diagnosis not present

## 2017-08-08 DIAGNOSIS — M8588 Other specified disorders of bone density and structure, other site: Secondary | ICD-10-CM | POA: Diagnosis not present

## 2017-09-05 DIAGNOSIS — D3612 Benign neoplasm of peripheral nerves and autonomic nervous system, upper limb, including shoulder: Secondary | ICD-10-CM | POA: Diagnosis not present

## 2017-09-05 DIAGNOSIS — L918 Other hypertrophic disorders of the skin: Secondary | ICD-10-CM | POA: Diagnosis not present

## 2017-09-05 DIAGNOSIS — D225 Melanocytic nevi of trunk: Secondary | ICD-10-CM | POA: Diagnosis not present

## 2017-09-05 DIAGNOSIS — L821 Other seborrheic keratosis: Secondary | ICD-10-CM | POA: Diagnosis not present

## 2017-09-05 DIAGNOSIS — D485 Neoplasm of uncertain behavior of skin: Secondary | ICD-10-CM | POA: Diagnosis not present

## 2017-09-05 DIAGNOSIS — L280 Lichen simplex chronicus: Secondary | ICD-10-CM | POA: Diagnosis not present

## 2017-09-05 DIAGNOSIS — L570 Actinic keratosis: Secondary | ICD-10-CM | POA: Diagnosis not present

## 2017-09-05 DIAGNOSIS — L820 Inflamed seborrheic keratosis: Secondary | ICD-10-CM | POA: Diagnosis not present

## 2017-09-05 DIAGNOSIS — C44319 Basal cell carcinoma of skin of other parts of face: Secondary | ICD-10-CM | POA: Diagnosis not present

## 2017-09-25 DIAGNOSIS — Z961 Presence of intraocular lens: Secondary | ICD-10-CM | POA: Diagnosis not present

## 2018-01-14 DIAGNOSIS — R7301 Impaired fasting glucose: Secondary | ICD-10-CM | POA: Diagnosis not present

## 2018-01-14 DIAGNOSIS — Z1389 Encounter for screening for other disorder: Secondary | ICD-10-CM | POA: Diagnosis not present

## 2018-01-14 DIAGNOSIS — I10 Essential (primary) hypertension: Secondary | ICD-10-CM | POA: Diagnosis not present

## 2018-01-14 DIAGNOSIS — Z0000 Encounter for general adult medical examination without abnormal findings: Secondary | ICD-10-CM | POA: Diagnosis not present

## 2018-01-14 DIAGNOSIS — J45909 Unspecified asthma, uncomplicated: Secondary | ICD-10-CM | POA: Diagnosis not present

## 2018-01-14 DIAGNOSIS — K219 Gastro-esophageal reflux disease without esophagitis: Secondary | ICD-10-CM | POA: Diagnosis not present

## 2018-01-14 DIAGNOSIS — M899 Disorder of bone, unspecified: Secondary | ICD-10-CM | POA: Diagnosis not present

## 2018-01-14 DIAGNOSIS — R609 Edema, unspecified: Secondary | ICD-10-CM | POA: Diagnosis not present

## 2018-01-14 DIAGNOSIS — R5383 Other fatigue: Secondary | ICD-10-CM | POA: Diagnosis not present

## 2018-01-14 DIAGNOSIS — E7800 Pure hypercholesterolemia, unspecified: Secondary | ICD-10-CM | POA: Diagnosis not present

## 2018-01-14 DIAGNOSIS — Z23 Encounter for immunization: Secondary | ICD-10-CM | POA: Diagnosis not present

## 2018-01-24 ENCOUNTER — Other Ambulatory Visit: Payer: Self-pay | Admitting: Family Medicine

## 2018-01-24 DIAGNOSIS — Z1231 Encounter for screening mammogram for malignant neoplasm of breast: Secondary | ICD-10-CM

## 2018-02-28 ENCOUNTER — Ambulatory Visit
Admission: RE | Admit: 2018-02-28 | Discharge: 2018-02-28 | Disposition: A | Discharge status: Home / Self Care | Payer: Medicare Other | Source: Ambulatory Visit | Attending: Family Medicine | Admitting: Family Medicine

## 2018-02-28 DIAGNOSIS — Z1231 Encounter for screening mammogram for malignant neoplasm of breast: Secondary | ICD-10-CM | POA: Diagnosis not present

## 2018-09-12 DIAGNOSIS — J45909 Unspecified asthma, uncomplicated: Secondary | ICD-10-CM | POA: Diagnosis not present

## 2018-09-12 DIAGNOSIS — K219 Gastro-esophageal reflux disease without esophagitis: Secondary | ICD-10-CM | POA: Diagnosis not present

## 2018-09-12 DIAGNOSIS — E7800 Pure hypercholesterolemia, unspecified: Secondary | ICD-10-CM | POA: Diagnosis not present

## 2018-09-12 DIAGNOSIS — R197 Diarrhea, unspecified: Secondary | ICD-10-CM | POA: Diagnosis not present

## 2018-09-12 DIAGNOSIS — M899 Disorder of bone, unspecified: Secondary | ICD-10-CM | POA: Diagnosis not present

## 2018-09-12 DIAGNOSIS — R7301 Impaired fasting glucose: Secondary | ICD-10-CM | POA: Diagnosis not present

## 2018-09-12 DIAGNOSIS — I10 Essential (primary) hypertension: Secondary | ICD-10-CM | POA: Diagnosis not present

## 2018-09-18 DIAGNOSIS — R7301 Impaired fasting glucose: Secondary | ICD-10-CM | POA: Diagnosis not present

## 2018-09-18 DIAGNOSIS — I10 Essential (primary) hypertension: Secondary | ICD-10-CM | POA: Diagnosis not present

## 2018-09-18 DIAGNOSIS — E7800 Pure hypercholesterolemia, unspecified: Secondary | ICD-10-CM | POA: Diagnosis not present

## 2018-09-18 DIAGNOSIS — R197 Diarrhea, unspecified: Secondary | ICD-10-CM | POA: Diagnosis not present

## 2018-10-01 DIAGNOSIS — H524 Presbyopia: Secondary | ICD-10-CM | POA: Diagnosis not present

## 2018-10-01 DIAGNOSIS — H52203 Unspecified astigmatism, bilateral: Secondary | ICD-10-CM | POA: Diagnosis not present

## 2018-10-01 DIAGNOSIS — Z961 Presence of intraocular lens: Secondary | ICD-10-CM | POA: Diagnosis not present

## 2019-01-27 ENCOUNTER — Other Ambulatory Visit: Payer: Self-pay | Admitting: Family Medicine

## 2019-01-27 DIAGNOSIS — Z1231 Encounter for screening mammogram for malignant neoplasm of breast: Secondary | ICD-10-CM

## 2019-02-10 ENCOUNTER — Ambulatory Visit
Admission: RE | Admit: 2019-02-10 | Discharge: 2019-02-10 | Disposition: A | Discharge status: Home / Self Care | Payer: Medicare Other | Source: Ambulatory Visit | Attending: Family Medicine | Admitting: Family Medicine

## 2019-02-10 ENCOUNTER — Other Ambulatory Visit: Payer: Self-pay | Admitting: Family Medicine

## 2019-02-10 DIAGNOSIS — Z23 Encounter for immunization: Secondary | ICD-10-CM | POA: Diagnosis not present

## 2019-02-10 DIAGNOSIS — R0600 Dyspnea, unspecified: Secondary | ICD-10-CM

## 2019-02-10 DIAGNOSIS — E7800 Pure hypercholesterolemia, unspecified: Secondary | ICD-10-CM | POA: Diagnosis not present

## 2019-02-10 DIAGNOSIS — Z1389 Encounter for screening for other disorder: Secondary | ICD-10-CM | POA: Diagnosis not present

## 2019-02-10 DIAGNOSIS — D692 Other nonthrombocytopenic purpura: Secondary | ICD-10-CM | POA: Diagnosis not present

## 2019-02-10 DIAGNOSIS — R0602 Shortness of breath: Secondary | ICD-10-CM | POA: Diagnosis not present

## 2019-02-10 DIAGNOSIS — R0609 Other forms of dyspnea: Secondary | ICD-10-CM | POA: Diagnosis not present

## 2019-02-10 DIAGNOSIS — R7301 Impaired fasting glucose: Secondary | ICD-10-CM | POA: Diagnosis not present

## 2019-02-10 DIAGNOSIS — R079 Chest pain, unspecified: Secondary | ICD-10-CM | POA: Diagnosis not present

## 2019-02-10 DIAGNOSIS — J45909 Unspecified asthma, uncomplicated: Secondary | ICD-10-CM | POA: Diagnosis not present

## 2019-02-10 DIAGNOSIS — M899 Disorder of bone, unspecified: Secondary | ICD-10-CM | POA: Diagnosis not present

## 2019-02-10 DIAGNOSIS — Z0000 Encounter for general adult medical examination without abnormal findings: Secondary | ICD-10-CM | POA: Diagnosis not present

## 2019-02-10 DIAGNOSIS — I10 Essential (primary) hypertension: Secondary | ICD-10-CM | POA: Diagnosis not present

## 2019-02-10 DIAGNOSIS — K219 Gastro-esophageal reflux disease without esophagitis: Secondary | ICD-10-CM | POA: Diagnosis not present

## 2019-03-03 DIAGNOSIS — R0609 Other forms of dyspnea: Secondary | ICD-10-CM | POA: Diagnosis not present

## 2019-03-03 DIAGNOSIS — R079 Chest pain, unspecified: Secondary | ICD-10-CM | POA: Diagnosis not present

## 2019-03-03 DIAGNOSIS — I10 Essential (primary) hypertension: Secondary | ICD-10-CM | POA: Diagnosis not present

## 2019-03-14 ENCOUNTER — Ambulatory Visit
Admission: RE | Admit: 2019-03-14 | Discharge: 2019-03-14 | Disposition: A | Discharge status: Home / Self Care | Payer: Medicare Other | Source: Ambulatory Visit | Attending: Family Medicine | Admitting: Family Medicine

## 2019-03-14 ENCOUNTER — Other Ambulatory Visit: Payer: Self-pay

## 2019-03-14 DIAGNOSIS — Z1231 Encounter for screening mammogram for malignant neoplasm of breast: Secondary | ICD-10-CM | POA: Diagnosis not present

## 2019-04-02 ENCOUNTER — Telehealth: Payer: Self-pay | Admitting: Cardiology

## 2019-04-02 NOTE — Telephone Encounter (Signed)
Patient has dementia and can not remember things too well. She will need her Husband to come with her to her appointment on Monday 04/07/19

## 2019-04-02 NOTE — Telephone Encounter (Signed)
I called and left message ok fpr pt's husband to come to the appt due to pt has dementia. I will make a note on the appt. Lmom both parties will need to wear their mask for the entire visit.

## 2019-04-07 ENCOUNTER — Ambulatory Visit (INDEPENDENT_AMBULATORY_CARE_PROVIDER_SITE_OTHER): Payer: Medicare Other | Admitting: Cardiology

## 2019-04-07 ENCOUNTER — Encounter: Payer: Self-pay | Admitting: Cardiology

## 2019-04-07 ENCOUNTER — Other Ambulatory Visit: Payer: Self-pay

## 2019-04-07 VITALS — BP 168/110 | HR 69 | Ht 62.0 in | Wt 186.6 lb

## 2019-04-07 DIAGNOSIS — I10 Essential (primary) hypertension: Secondary | ICD-10-CM | POA: Diagnosis not present

## 2019-04-07 DIAGNOSIS — R072 Precordial pain: Secondary | ICD-10-CM

## 2019-04-07 DIAGNOSIS — R0602 Shortness of breath: Secondary | ICD-10-CM

## 2019-04-07 MED ORDER — METOPROLOL TARTRATE 100 MG PO TABS: 100.0000 mg | ORAL_TABLET | Freq: Once | ORAL | 0 refills | Status: DC

## 2019-04-07 NOTE — Progress Notes (Signed)
Cardiology Consult Note    Date:  04/07/2019   ID:  Connie Weber, DOB 16-Oct-1940, MRN [id number]  PCP:  Gaynelle Arabian, MD  Cardiologist:  Fransico Him, MD   No chief complaint on file.   History of Present Illness:  Connie Weber is a 78 y.o. female who is being seen today for the evaluation of chest pain at the request of Gaynelle Arabian, MD.  This is a 78yo female with a hx of HTN, HLD, asthma, GERD and CKD stage 3.  Her husband is my patient as well.  She recently started having problems with DOE which she had attributed to possible weight gain.  She had gained about 10lbs due to being more sedentary with COVID but has lost some of this.  She has also been having some chest discomfort.  She tells me that recently she was sitting in the pain clinic waiting room waiting for her husband and she develop a tightness around her chest under her breasts.  She tried to change positions but this did not help and finally unhooked her bra and felt better.  She tells me that she has been having these types of "spells" sporadically and can come on with rest or exertion.  She has no diaphoresis with the episodes.  She smoked for about 5 years and has no fm hx of CAD.  She also has had problems with labile BP.  Her BP meds were recently decreased due to problems with low Bps.  She brings in her readings today which showed her BP is very well controlled despite being elevated in our office.     Past Medical History:  Diagnosis Date  . Arthritis   . Asthma   . Cancer ([place])    basal cell   . CKD (chronic kidney disease) stage 3, GFR 30-59 ml/min   . Complication of anesthesia    difficulty waking up, feels that it was due to not having an inhaler used prior to surgery  . GERD (gastroesophageal reflux disease)   . GERD without esophagitis   . H/O hiatal hernia   . Headache(784.0)    migraines as a young adult  . History of basal cell carcinoma of skin   . Hypercholesteremia   . Hypertension   .  Osteopenia   . Prediabetes     Past Surgical History:  Procedure Laterality Date  . ABDOMINAL HYSTERECTOMY    . APPENDECTOMY    . CHOLECYSTECTOMY    . COLONOSCOPY    . EYE SURGERY     cataract surgery x 2 and implants  . ORIF ANKLE FRACTURE Right 12/25/2012   Procedure: OPEN REDUCTION INTERNAL FIXATION (ORIF) RIGHT ANKLE FRACTURE;  Surgeon: Marianna Payment, MD;  Location: [facility];  Service: Orthopedics;  Laterality: Right;  . rotator cuff surgery Left 2013    Current Medications: Current Meds  Medication Sig  . albuterol (VENTOLIN HFA) 108 (90 Base) MCG/ACT inhaler Inhale 1-2 puffs into the lungs every 4 (four) hours as needed for wheezing or shortness of breath.  Marland Kitchen aspirin 325 MG tablet Take 1 tablet (325 mg total) by mouth 2 (two) times daily.  Marland Kitchen BIOTIN 5000 PO Take by mouth daily.  . Calcium Carbonate-Vitamin D (CALTRATE 600+D) 600-400 MG-UNIT per tablet Take 1 tablet by mouth 2 (two) times daily.  . famotidine (PEPCID) 20 MG tablet Take 20 mg by mouth daily.  . Glucosamine-Chondroitin (GLUCOSAMINE CHONDR COMPLEX PO) Take 1 tablet by mouth daily.  Marland Kitchen  ibuprofen (ADVIL,MOTRIN) 200 MG tablet Take 400 mg by mouth every 6 (six) hours as needed for pain.  Marland Kitchen lovastatin (MEVACOR) 10 MG tablet Take 10 mg by mouth daily.  . Multiple Vitamin (MULTIVITAMIN WITH MINERALS) TABS tablet Take 1 tablet by mouth daily.  . niacin 500 MG tablet Take 500 mg by mouth at bedtime.  . NON FORMULARY APPLE CIDER VINEGAR 1-2 TEASPOONS BY MOUTH MIXED IN WATER DAILY  . Omega-3 Fatty Acids (FISH OIL) 1000 MG CAPS Take by mouth 2 (two) times daily.  Marland Kitchen omeprazole (PRILOSEC) 20 MG capsule Take 20 mg by mouth daily.  Marland Kitchen oxyCODONE (OXY IR/ROXICODONE) 5 MG immediate release tablet Take 1-2 tablets (5-10 mg total) by mouth every 4 (four) hours as needed for pain.  . promethazine (PHENERGAN) 25 MG tablet Take 1 tablet (25 mg total) by mouth every 6 (six) hours as needed for nausea.  . pseudoephedrine (PSUDATABS) 30 MG  tablet Take 30 mg by mouth every 4 (four) hours as needed for congestion.  Marland Kitchen telmisartan-hydrochlorothiazide (MICARDIS HCT) 80-12.5 MG tablet 1/2 tablet Orally Once a day for blood pressure  . vitamin C (ASCORBIC ACID) 500 MG tablet Take 500 mg by mouth 2 (two) times daily.  Marland Kitchen zinc gluconate 50 MG tablet Take 1 tablet (50 mg total) by mouth daily.    Allergies:   Ace inhibitors, Actonel [risedronate sodium], Fosamax [alendronate sodium], Latex, and Prednisone   Social History   Socioeconomic History  . Marital status: Married    Spouse name: Not on file  . Number of children: Not on file  . Years of education: Not on file  . Highest education level: Not on file  Occupational History  . Not on file  Tobacco Use  . Smoking status: Former Research scientist (life sciences)  . Smokeless tobacco: Never Used  Substance and Sexual Activity  . Alcohol use: No  . Drug use: No  . Sexual activity: Not on file  Other Topics Concern  . Not on file  Social History Narrative  . Not on file   Social Determinants of Health   Financial Resource Strain:   . Difficulty of Paying Living Expenses: Not on file  Food Insecurity:   . Worried About Charity fundraiser in the Last Year: Not on file  . Ran Out of Food in the Last Year: Not on file  Transportation Needs:   . Lack of Transportation (Medical): Not on file  . Lack of Transportation (Non-Medical): Not on file  Physical Activity:   . Days of Exercise per Week: Not on file  . Minutes of Exercise per Session: Not on file  Stress:   . Feeling of Stress : Not on file  Social Connections:   . Frequency of Communication with Friends and Family: Not on file  . Frequency of Social Gatherings with Friends and Family: Not on file  . Attends Religious Services: Not on file  . Active Member of Clubs or Organizations: Not on file  . Attends Archivist Meetings: Not on file  . Marital Status: Not on file     Family History:  The patient's family history  includes Breast cancer in her sister, sister, and sister.   ROS:   Please see the history of present illness.    ROS All other systems reviewed and are negative.  No flowsheet data found.     PHYSICAL EXAM:   VS:  BP (!) 168/110   Pulse 69   Ht 5\' 2"  (1.575 m)  Wt 186 lb 9.6 oz (84.6 kg)   SpO2 97%   BMI 34.13 kg/m    GEN: Well nourished, well developed, in no acute distress  HEENT: normal  Neck: no JVD, carotid bruits, or masses Cardiac: RRR; no murmurs, rubs, or gallops,no edema.  Intact distal pulses bilaterally.  Respiratory:  clear to auscultation bilaterally, normal work of breathing GI: soft, nontender, nondistended, + BS MS: no deformity or atrophy  Skin: warm and dry, no rash Neuro:  Alert and Oriented x 3, Strength and sensation are intact Psych: euthymic mood, full affect  Wt Readings from Last 3 Encounters:  04/07/19 186 lb 9.6 oz (84.6 kg)  12/24/12 175 lb (79.4 kg)      Studies/Labs Reviewed:   EKG:  EKG is ordered today.  The ekg ordered today demonstrates NSR with no ST change  Recent Labs: No results found for requested labs within last 8760 hours.   Lipid Panel No results found for: CHOL, TRIG, HDL, CHOLHDL, VLDL, LDLCALC, LDLDIRECT  Additional studies/ records that were reviewed today include:  office notes from PCP   ASSESSMENT:    1. Precordial pain   2. Shortness of breath      PLAN:  In order of problems listed above:  1. Chest pain -Chest discomfort is somewhat atypical in that it occurs with rest and exertion -it was improved after she unhooked her bra and she does have GERD -EKG is non ischemic -she does have several CRFs including HTN, HLD, and preDM -I will get a coronary CTA to define coronary anatomy  2.  SOB -unclear if related to weight gain -as above coronary CTA to define anatomy -I do not hear any heart murmurs but will get an echo to assess LVF and diastolic function  3.  HTN -poorly controlled on exam but  she brought in an extensive list of Bps that are all normal so I  Suspect she has white coat HTN -continue Micardis HCT 80-12.5mg  1/2 tab daily   Medication Adjustments/Labs and Tests Ordered: Current medicines are reviewed at length with the patient today.  Concerns regarding medicines are outlined above.  Medication changes, Labs and Tests ordered today are listed in the Patient Instructions below.  Patient Instructions  Medication Instructions:  Your physician recommends that you continue on your current medications as directed. Please refer to the Current Medication list given to you today.  *If you need a refill on your cardiac medications before your next appointment, please call your pharmacy*  Lab Work: You will have a BMET drawn prior to your CT scan.   If you have labs (blood work) drawn today and your tests are completely normal, you will receive your results only by: Marland Kitchen MyChart Message (if you have MyChart) OR . A paper copy in the mail If you have any lab test that is abnormal or we need to change your treatment, we will call you to review the results.  Testing/Procedures: Your physician has requested that you have an echocardiogram. Echocardiography is a painless test that uses sound waves to create images of your heart. It provides your doctor with information about the size and shape of your heart and how well your heart's chambers and valves are working. This procedure takes approximately one hour. There are no restrictions for this procedure.  Cardiac CT Angiography (CTA), is a special type of CT scan that uses a computer to produce multi-dimensional views of major blood vessels throughout the body. In CT angiography, a contrast  material is injected through an IV to help visualize the blood vessels  Follow-Up: At [facility], you and your health needs are our priority.  As part of our continuing mission to provide you with exceptional heart care, we have created  designated Provider Care Teams.  These Care Teams include your primary Cardiologist (physician) and Advanced Practice Providers (APPs -  Physician Assistants and Nurse Practitioners) who all work together to provide you with the care you need, when you need it.  Your next appointment:   Follow-up with Dr. Radford Pax as needed based on testing results.   Other Instructions:  Your cardiac CT will be scheduled at one of the below locations:   [facility] [address] [place], [place] [address] (336) [place] [address] [place], [place] [address] [phone]  If scheduled at [facility], please arrive at the [facility] main entrance of [facility] 30-45 minutes prior to test start time. Proceed to the [facility] Radiology Department (first floor) to check-in and test prep.  If scheduled at [facility], please arrive 15 mins early for check-in and test prep.  Please follow these instructions carefully (unless otherwise directed):  On the Night Before the Test: . Be sure to Drink plenty of water. . Do not consume any caffeinated/decaffeinated beverages or chocolate 12 hours prior to your test. . Do not take any antihistamines 12 hours prior to your test.  On the Day of the Test: . Drink plenty of water. Do not drink any water within one hour of the test. . Do not eat any food 4 hours prior to the test. . You may take your regular medications prior to the test.  . Take metoprolol (Lopressor) two hours prior to test. . HOLD Furosemide/Hydrochlorothiazide morning of the test. . FEMALES- please wear underwire-free bra if available       After the Test: . Drink plenty of water. . After receiving IV contrast, you may experience a mild flushed feeling. This is normal. . On occasion, you may experience a mild rash up to 24 hours after the test. This is not dangerous.  If this occurs, you can take Benadryl 25 mg and increase your fluid intake. . If you experience trouble breathing, this can be serious. If it is severe call 911 IMMEDIATELY. If it is mild, please call our office. . If you take any of these medications: Glipizide/Metformin, Avandament, Glucavance, please do not take 48 hours after completing test unless otherwise instructed.   Once we have confirmed authorization from your insurance company, we will call you to set up a date and time for your test.   For non-scheduling related questions, please contact the cardiac imaging nurse navigator should you have any questions/concerns: Marchia Bond, RN Navigator Cardiac Imaging [facility] Services [phone] Office      Signed, Fransico Him, MD  04/07/2019 10:28 AM    [address], [place], [place]  [address] Phone: [phone]; Fax: [phone]

## 2019-04-07 NOTE — Patient Instructions (Addendum)
Medication Instructions:  Your physician recommends that you continue on your current medications as directed. Please refer to the Current Medication list given to you today.  *If you need a refill on your cardiac medications before your next appointment, please call your pharmacy*  Lab Work: You will have a BMET drawn prior to your CT scan.   If you have labs (blood work) drawn today and your tests are completely normal, you will receive your results only by: Marland Kitchen MyChart Message (if you have MyChart) OR . A paper copy in the mail If you have any lab test that is abnormal or we need to change your treatment, we will call you to review the results.  Testing/Procedures: Your physician has requested that you have an echocardiogram. Echocardiography is a painless test that uses sound waves to create images of your heart. It provides your doctor with information about the size and shape of your heart and how well your heart's chambers and valves are working. This procedure takes approximately one hour. There are no restrictions for this procedure.  Cardiac CT Angiography (CTA), is a special type of CT scan that uses a computer to produce multi-dimensional views of major blood vessels throughout the body. In CT angiography, a contrast material is injected through an IV to help visualize the blood vessels  Follow-Up: At [facility], you and your health needs are our priority.  As part of our continuing mission to provide you with exceptional heart care, we have created designated Provider Care Teams.  These Care Teams include your primary Cardiologist (physician) and Advanced Practice Providers (APPs -  Physician Assistants and Nurse Practitioners) who all work together to provide you with the care you need, when you need it.  Your next appointment:   Follow-up with Dr. Radford Pax as needed based on testing results.   Other Instructions:  Your cardiac CT will be scheduled at one of the below  locations:   [facility] [address] [place], [place] [address] (336) [place] [address] [place], [place] [address] [phone]  If scheduled at [facility], please arrive at the [facility] main entrance of [facility] 30-45 minutes prior to test start time. Proceed to the [facility] Radiology Department (first floor) to check-in and test prep.  If scheduled at [facility], please arrive 15 mins early for check-in and test prep.  Please follow these instructions carefully (unless otherwise directed):  On the Night Before the Test: . Be sure to Drink plenty of water. . Do not consume any caffeinated/decaffeinated beverages or chocolate 12 hours prior to your test. . Do not take any antihistamines 12 hours prior to your test.  On the Day of the Test: . Drink plenty of water. Do not drink any water within one hour of the test. . Do not eat any food 4 hours prior to the test. . You may take your regular medications prior to the test.  . Take metoprolol (Lopressor) two hours prior to test. . HOLD Furosemide/Hydrochlorothiazide morning of the test. . FEMALES- please wear underwire-free bra if available       After the Test: . Drink plenty of water. . After receiving IV contrast, you may experience a mild flushed feeling. This is normal. . On occasion, you may experience a mild rash up to 24 hours after the test. This is not dangerous. If this occurs, you can take Benadryl  25 mg and increase your fluid intake. . If you experience trouble breathing, this can be serious. If it is severe call 911 IMMEDIATELY. If it is mild, please call our office. . If you take any of these medications: Glipizide/Metformin, Avandament, Glucavance, please do not take 48 hours after completing test unless otherwise instructed.   Once we have confirmed authorization from your  insurance company, we will call you to set up a date and time for your test.   For non-scheduling related questions, please contact the cardiac imaging nurse navigator should you have any questions/concerns: Marchia Bond, RN Navigator Cardiac Imaging [facility] [phone] Office

## 2019-04-15 ENCOUNTER — Encounter: Payer: Self-pay | Admitting: Cardiology

## 2019-04-15 ENCOUNTER — Ambulatory Visit (HOSPITAL_COMMUNITY): Payer: Medicare Other | Attending: Cardiology

## 2019-04-15 ENCOUNTER — Other Ambulatory Visit: Payer: Self-pay

## 2019-04-15 DIAGNOSIS — R0602 Shortness of breath: Secondary | ICD-10-CM | POA: Insufficient documentation

## 2019-04-15 DIAGNOSIS — I77810 Thoracic aortic ectasia: Secondary | ICD-10-CM | POA: Insufficient documentation

## 2019-04-15 DIAGNOSIS — R072 Precordial pain: Secondary | ICD-10-CM | POA: Insufficient documentation

## 2019-04-16 ENCOUNTER — Telehealth: Payer: Self-pay

## 2019-04-16 DIAGNOSIS — R072 Precordial pain: Secondary | ICD-10-CM

## 2019-04-16 DIAGNOSIS — R0602 Shortness of breath: Secondary | ICD-10-CM

## 2019-04-16 NOTE — Telephone Encounter (Signed)
-----   Message from Sueanne Margarita, MD sent at 04/15/2019  5:03 PM EST ----- Echo showed normal heart function with mildly thickened and stiff heart muscle, trivial leakiness of the MV and mildly dilated ascending aorta at 67mm.  Repeat limited echo in 1 year for dilated ascending aorta.  Please have her come in for a BNP since she has SOB and noted to have diastolic dysfunction on exam

## 2019-04-16 NOTE — Telephone Encounter (Signed)
The patient has been notified of the result and verbalized understanding.  All questions (if any) were answered. Patient will come in for labs on 12/29. She reports that she is still having some SOB.  Antonieta Iba, RN 04/16/2019 8:23 AM

## 2019-04-22 ENCOUNTER — Other Ambulatory Visit: Payer: Self-pay

## 2019-04-22 ENCOUNTER — Other Ambulatory Visit: Payer: Medicare Other | Admitting: *Deleted

## 2019-04-22 DIAGNOSIS — R0602 Shortness of breath: Secondary | ICD-10-CM

## 2019-04-23 ENCOUNTER — Telehealth: Payer: Self-pay

## 2019-04-23 LAB — PRO B NATRIURETIC PEPTIDE: NT-Pro BNP: 96 pg/mL (ref 0–738)

## 2019-04-23 NOTE — Telephone Encounter (Signed)
The patient has been notified of the result and verbalized understanding.  All questions (if any) were answered. Patient also went over her medication list that was on her AVS from her previous visit. There were some medications that were incorrect. I have updated these.  Antonieta Iba, RN 04/23/2019 3:09 PM

## 2019-04-23 NOTE — Telephone Encounter (Signed)
-----   Message from Sueanne Margarita, MD sent at 04/23/2019 11:56 AM EST ----- Please let patient know that labs were normal.  Continue current medical therapy.

## 2019-04-24 ENCOUNTER — Telehealth: Payer: Self-pay | Admitting: Cardiology

## 2019-04-24 NOTE — Telephone Encounter (Signed)
Follow up: ° ° ° °Patient returning your call. Please call patient back. °

## 2019-04-24 NOTE — Telephone Encounter (Signed)
Patient calling to let me know that her PCP changed her medications. She is now taking her telmisartan-hydrochlorithiazide separately. Telmisartan 40 mg daily and hydrochlorithiazide 12.5 mg daily. She does report that she has had some increased swelling in her feet and legs and would like to know if Dr. Radford Pax thinks that she should increase her HCTZ. Patient denies any increased shortness of breath or weight gain.

## 2019-04-24 NOTE — Telephone Encounter (Signed)
Since her PCP is managing her BP, recommend letting them know about her swelling and see what her PCP wants to do

## 2019-04-28 NOTE — Telephone Encounter (Signed)
Informed patient that she should reach out to her PCP to see what they would like to do with her medications in regards to her increased swelling. Patient verbalized understanding.

## 2019-05-08 ENCOUNTER — Other Ambulatory Visit: Payer: Self-pay

## 2019-05-08 ENCOUNTER — Encounter (INDEPENDENT_AMBULATORY_CARE_PROVIDER_SITE_OTHER): Payer: Self-pay

## 2019-05-08 ENCOUNTER — Other Ambulatory Visit: Payer: Medicare Other | Admitting: *Deleted

## 2019-05-08 DIAGNOSIS — R0602 Shortness of breath: Secondary | ICD-10-CM | POA: Diagnosis not present

## 2019-05-08 DIAGNOSIS — R072 Precordial pain: Secondary | ICD-10-CM | POA: Diagnosis not present

## 2019-05-08 LAB — BASIC METABOLIC PANEL
BUN/Creatinine Ratio: 15 (ref 12–28)
BUN: 15 mg/dL (ref 8–27)
CO2: 23 mmol/L (ref 20–29)
Calcium: 9.7 mg/dL (ref 8.7–10.3)
Chloride: 101 mmol/L (ref 96–106)
Creatinine, Ser: 0.98 mg/dL (ref 0.57–1.00)
GFR calc Af Amer: 64 mL/min/{1.73_m2} (ref >59)
GFR calc non Af Amer: 55 mL/min/{1.73_m2} — ABNORMAL LOW (ref >59)
Glucose: 104 mg/dL — ABNORMAL HIGH (ref 65–99)
Potassium: 3.8 mmol/L (ref 3.5–5.2)
Sodium: 141 mmol/L (ref 134–144)

## 2019-05-22 ENCOUNTER — Other Ambulatory Visit: Payer: Medicare Other

## 2019-05-23 ENCOUNTER — Encounter (HOSPITAL_COMMUNITY): Payer: Self-pay

## 2019-05-26 ENCOUNTER — Telehealth (HOSPITAL_COMMUNITY): Payer: Self-pay | Admitting: Emergency Medicine

## 2019-05-26 NOTE — Telephone Encounter (Signed)
Reaching out to patient to offer assistance regarding upcoming cardiac imaging study; pt verbalizes understanding of appt date/time, parking situation and where to check in, pre-test NPO status and medications ordered, and verified current allergies; name and call back number provided for further questions should they arise Alejandrina Raimer RN Navigator Cardiac Imaging [place] [facility] [facility] [phone] office [phone] cell 

## 2019-05-27 ENCOUNTER — Ambulatory Visit (HOSPITAL_COMMUNITY)
Admission: RE | Admit: 2019-05-27 | Discharge: 2019-05-27 | Disposition: A | Discharge status: Home / Self Care | Payer: Medicare Other | Source: Ambulatory Visit | Attending: Cardiology | Admitting: Cardiology

## 2019-05-27 ENCOUNTER — Other Ambulatory Visit: Payer: Self-pay

## 2019-05-27 DIAGNOSIS — R072 Precordial pain: Secondary | ICD-10-CM

## 2019-05-27 MED ORDER — NITROGLYCERIN 0.4 MG SL SUBL: 0.8000 mg | SUBLINGUAL_TABLET | Freq: Once | SUBLINGUAL | Status: AC

## 2019-05-27 MED ORDER — IOHEXOL 350 MG/ML SOLN
100.0000 mL | Freq: Once | INTRAVENOUS | Status: AC | PRN
  Administered 2019-05-27: 100 mL via INTRAVENOUS

## 2019-05-27 MED ORDER — NITROGLYCERIN 0.4 MG SL SUBL
SUBLINGUAL_TABLET | SUBLINGUAL | Status: AC
  Administered 2019-05-27: 0.8 mg via SUBLINGUAL
  Filled 2019-05-27: qty 2

## 2019-05-27 NOTE — Progress Notes (Signed)
Patient ID: Connie Weber, female   DOB: Jan 28, 1941, 79 y.o.   MRN: [id number]   Pt reporting some dizziness with standing; assisted to recliner; VS assessed and stable; offered refreshments, tolerating coffee and cookies

## 2019-05-27 NOTE — Progress Notes (Signed)
Patient ID: Connie Weber, female   DOB: [birth year], 79 y.o.   MRN: [id number]   Pt tolerated exam without incident; pt denies lightheadedness or dizziness; pt ambulatory to lobby steady gait noted

## 2019-05-28 ENCOUNTER — Ambulatory Visit (HOSPITAL_COMMUNITY)
Admission: RE | Admit: 2019-05-28 | Discharge: 2019-05-28 | Disposition: A | Discharge status: Home / Self Care | Payer: Medicare Other | Source: Ambulatory Visit | Attending: Cardiology | Admitting: Cardiology

## 2019-05-28 DIAGNOSIS — R072 Precordial pain: Secondary | ICD-10-CM | POA: Diagnosis not present

## 2019-05-28 DIAGNOSIS — I700 Atherosclerosis of aorta: Secondary | ICD-10-CM | POA: Diagnosis not present

## 2019-05-28 DIAGNOSIS — R911 Solitary pulmonary nodule: Secondary | ICD-10-CM | POA: Diagnosis not present

## 2019-05-28 DIAGNOSIS — I2510 Atherosclerotic heart disease of native coronary artery without angina pectoris: Secondary | ICD-10-CM | POA: Diagnosis not present

## 2019-05-29 ENCOUNTER — Telehealth: Payer: Self-pay

## 2019-05-29 DIAGNOSIS — R072 Precordial pain: Secondary | ICD-10-CM | POA: Diagnosis not present

## 2019-05-29 DIAGNOSIS — E7800 Pure hypercholesterolemia, unspecified: Secondary | ICD-10-CM

## 2019-05-29 MED ORDER — ATORVASTATIN CALCIUM 40 MG PO TABS: 40.0000 mg | ORAL_TABLET | Freq: Every day | ORAL | 3 refills | Status: DC

## 2019-05-29 MED ORDER — ISOSORBIDE MONONITRATE ER 30 MG PO TB24: 15.0000 mg | ORAL_TABLET | Freq: Every day | ORAL | 3 refills | Status: DC

## 2019-05-29 NOTE — Telephone Encounter (Signed)
-----   Message from Sueanne Margarita, MD sent at 05/29/2019  4:16 PM EST ----- Labs from PCP reviewed and LDL is 143 with LDL goal < 70. Please start Lipitor 40mg  daily and repeat FLP and ALT in 6 weeks

## 2019-05-29 NOTE — Telephone Encounter (Signed)
-----   Message from Sueanne Margarita, MD sent at 05/29/2019 12:41 PM EST ----- Please let patient know that she does have some reduction in flow in a branch coronary vessel.  I would like to try medical management first.  Please start Imdur 15mg  daily and followup with PA in 2 weeks.  Please get a copy of her last FLP and if more than 6 months ago then repeat FLP and ALT.

## 2019-05-29 NOTE — Telephone Encounter (Signed)
Lipitor 40 mg has been sent in. Patient will call back tomorrow to let us know if she decides to start it and to schedule labs in 6 weeks.

## 2019-05-29 NOTE — Telephone Encounter (Signed)
The patient has been notified of the result and verbalized understanding.  All questions (if any) were answered. Antonieta Iba, RN 05/29/2019 2:21 PM   Patient will start on Imdur and follow up with PA on 02/19.

## 2019-05-30 ENCOUNTER — Telehealth: Payer: Self-pay | Admitting: Cardiology

## 2019-05-30 NOTE — Telephone Encounter (Signed)
Reviewed with pharmacist. Advised pt to stop Lovastatin. Called pharmacy and updated them to remove Lovastatin from medication list.

## 2019-05-30 NOTE — Telephone Encounter (Signed)
New message:     Pharmacy calling concering 2 refills for Atorvastatin and Lovastatin would like to know which one she should be taken. Please call the pharmacy.

## 2019-05-30 NOTE — Telephone Encounter (Signed)
Follow up   Patient is returning your call from yesterday. Please call to discuss the information in the previous message with the patient.

## 2019-05-30 NOTE — Telephone Encounter (Signed)
Pt pharmacy is requesting clarification on which medication is pt supposed to be taking or is pt taking both medications Atorvastatin and Lovastatin. Please clarify

## 2019-05-30 NOTE — Telephone Encounter (Signed)
Spoke with the patient regarding recent med changes. Pt will begin taking Lipitor and has been scheduled for FLP/ALT for March 15th

## 2019-06-10 NOTE — Progress Notes (Deleted)
Cardiology Office Note   Date:  06/10/2019   ID:  Connie Weber, DOB 1940-12-12, MRN [id number]  PCP:  Gaynelle Arabian, MD  Cardiologist:  Kathyrn Drown, NP   No chief complaint on file.     History of Present Illness: Connie Weber is a 79 y.o. female who presents for follow up, seen for Dr. Radford Pax.   Connie Weber has a hx of HTN, HLD, asthma, GERD and CKD Stage III.  She was most recently seen by Dr. Radford Pax 04/07/2019 for the evaluation of chest pain.  At that time, she began having problems with DOE which she had attributed to possible weight gain.  She had gained about 10lbs due to being more sedentary with COVID with associated chest discomfort. She was recently sitting in the pain clinic waiting room waiting for her husband and she develop a tightness around her chest under her breasts. She tried to change positions but this did not help and finally unhooked her bra and felt better.  She has no diaphoresis with the episodes.  EKG found to be nonischemic.  Plan was for coronary CTA given CRFs including HTN, HLD and pre-DM.  Coronary CTA performed 05/28/2019 which showed a possibly flow-limiting lesion in the diagonal branch and mid to distal LAD per FFR flow analysis.  Recommendations were for aggressive medical management and if continued anginal symptoms then LHC.  Per chart review, patient was notified of the reduction in flow in a branch coronary vessel.  Per Dr. Radford Pax, plan was for medical management with initiation of Imdur 15 mg p.o. daily and close follow-up with APP. Labs from PCP obtained which showed a recent LDL of 143 with a goal of less than 70. She was then started on Lipitor 40mg  PO QD. Repeat labs scheduled for 07/07/19    1.  Chest pain with nonobstructive CAD:   -Continue ASA 325?,  Atorvastatin 40, Imdur 15, metoprolol tartrate 100  2.  HTN: -Stable,  -Continue continue Micardis 40mg ? Micardis-HCTZ 40mg , metoprolol 100, HCTZ 12.5, Imdur 15   3.  HLD: -LDL  per PCP, 143. -Started on Lipitor 40 with plans for repeat lab work 07/07/19  Past Medical History:  Diagnosis Date  . Arthritis   . Ascending aorta dilatation (HCC)    23mm by echo 2020  . Asthma   . Cancer ([place])    basal cell   . CKD (chronic kidney disease) stage 3, GFR 30-59 ml/min   . Complication of anesthesia    difficulty waking up, feels that it was due to not having an inhaler used prior to surgery  . GERD (gastroesophageal reflux disease)   . GERD without esophagitis   . H/O hiatal hernia   . Headache(784.0)    migraines as a young adult  . History of basal cell carcinoma of skin   . Hypercholesteremia   . Hypertension   . Osteopenia   . Prediabetes     Past Surgical History:  Procedure Laterality Date  . ABDOMINAL HYSTERECTOMY    . APPENDECTOMY    . CHOLECYSTECTOMY    . COLONOSCOPY    . EYE SURGERY     cataract surgery x 2 and implants  . ORIF ANKLE FRACTURE Right 12/25/2012   Procedure: OPEN REDUCTION INTERNAL FIXATION (ORIF) RIGHT ANKLE FRACTURE;  Surgeon: Marianna Payment, MD;  Location: [facility];  Service: Orthopedics;  Laterality: Right;  . rotator cuff surgery Left 2013     Current Outpatient Medications  Medication Sig Dispense Refill  . albuterol (VENTOLIN HFA) 108 (90 Base) MCG/ACT inhaler Inhale 1-2 puffs into the lungs every 4 (four) hours as needed for wheezing or shortness of breath.    Marland Kitchen aspirin 325 MG tablet Take 1 tablet (325 mg total) by mouth 2 (two) times daily. (Patient taking differently: Take 325 mg by mouth 2 (two) times daily. As needed) 120 tablet 0  . atorvastatin (LIPITOR) 40 MG tablet Take 1 tablet (40 mg total) by mouth daily. 90 tablet 3  . BIOTIN 5000 PO Take by mouth daily.    . Calcium Carbonate-Vitamin D (CALTRATE 600+D) 600-400 MG-UNIT per tablet Take 1 tablet by mouth 2 (two) times daily.    . famotidine (PEPCID) 20 MG tablet Take 20 mg by mouth daily.    . Glucosamine-Chondroitin (GLUCOSAMINE CHONDR COMPLEX PO) Take 1 tablet  by mouth daily.    . hydrochlorothiazide (MICROZIDE) 12.5 MG capsule Take 12.5 mg by mouth daily.    Marland Kitchen ibuprofen (ADVIL,MOTRIN) 200 MG tablet Take 400 mg by mouth every 6 (six) hours as needed for pain.    . isosorbide mononitrate (IMDUR) 30 MG 24 hr tablet Take 0.5 tablets (15 mg total) by mouth daily. 45 tablet 3  . metoprolol tartrate (LOPRESSOR) 100 MG tablet Take 1 tablet (100 mg total) by mouth once for 1 dose. Take 2 hours prior to CT. 1 tablet 0  . Multiple Vitamin (MULTIVITAMIN WITH MINERALS) TABS tablet Take 1 tablet by mouth daily.    . niacin 500 MG tablet Take 500 mg by mouth at bedtime.    . NON FORMULARY APPLE CIDER VINEGAR 1-2 TEASPOONS BY MOUTH MIXED IN WATER DAILY    . Omega-3 Fatty Acids (FISH OIL) 1000 MG CAPS Take by mouth 2 (two) times daily.    Marland Kitchen omeprazole (PRILOSEC) 20 MG capsule Take 40 mg by mouth daily.     Marland Kitchen oxyCODONE (OXY IR/ROXICODONE) 5 MG immediate release tablet Take 1-2 tablets (5-10 mg total) by mouth every 4 (four) hours as needed for pain. (Patient not taking: Reported on 04/23/2019) 90 tablet 0  . promethazine (PHENERGAN) 25 MG tablet Take 1 tablet (25 mg total) by mouth every 6 (six) hours as needed for nausea. (Patient not taking: Reported on 04/23/2019) 60 tablet 0  . pseudoephedrine (PSUDATABS) 30 MG tablet Take 30 mg by mouth every 4 (four) hours as needed for congestion.    Marland Kitchen telmisartan (MICARDIS) 80 MG tablet Take 40 mg by mouth daily.     Marland Kitchen telmisartan-hydrochlorothiazide (MICARDIS HCT) 80-12.5 MG tablet 1/2 tablet Orally Once a day for blood pressure    . vitamin C (ASCORBIC ACID) 500 MG tablet Take 500 mg by mouth 2 (two) times daily.    Marland Kitchen zinc gluconate 50 MG tablet Take 1 tablet (50 mg total) by mouth daily. 30 tablet 0   No current facility-administered medications for this visit.    Allergies:   Ace inhibitors, Actonel [risedronate sodium], Fosamax [alendronate sodium], Latex, and Prednisone    Social History:  The patient  reports that  she has quit smoking. She has never used smokeless tobacco. She reports that she does not drink alcohol or use drugs.   Family History:  The patient's ***family history includes Breast cancer in her sister, sister, and sister.    ROS:  Please see the history of present illness.   Otherwise, review of systems are positive for {NONE DEFAULTED:18576::"none"}.   All other systems are reviewed and negative.    PHYSICAL  EXAM: VS:  There were no vitals taken for this visit. , BMI There is no height or weight on file to calculate BMI. GEN: Well nourished, well developed, in no acute distress HEENT: normal Neck: no JVD, carotid bruits, or masses Cardiac: ***RRR; no murmurs, rubs, or gallops,no edema  Respiratory:  clear to auscultation bilaterally, normal work of breathing GI: soft, nontender, nondistended, + BS MS: no deformity or atrophy Skin: warm and dry, no rash Neuro:  Strength and sensation are intact Psych: euthymic mood, full affect   EKG:  EKG {ACTION; IS/IS [id number] ordered today. The ekg ordered today demonstrates ***   Recent Labs: 04/22/2019: NT-Pro BNP 96 05/08/2019: BUN 15; Creatinine, Ser 0.98; Potassium 3.8; Sodium 141    Lipid Panel No results found for: CHOL, TRIG, HDL, CHOLHDL, VLDL, LDLCALC, LDLDIRECT    Wt Readings from Last 3 Encounters:  04/07/19 186 lb 9.6 oz (84.6 kg)  12/24/12 175 lb (79.4 kg)     Other studies Reviewed: Additional studies/ records that were reviewed today include: ***. Review of the above records demonstrates: ***  Coronary CTA 05/28/2019:  IMPRESSION: 1. CT FFR flow analysis demonstrates a possibly flow limiting lesion in the Diagonal branch and mid to distal LAD.  2. Recommend aggressive medical management and if continued Chest pain then consider cardiac cath.   ASSESSMENT AND PLAN:  1.  ***   Current medicines are reviewed at length with the patient today.  The patient {ACTIONS; HAS/DOES NOT HAVE:19233} concerns  regarding medicines.  The following changes have been made:  {PLAN; NO CHANGE:13088:s}  Labs/ tests ordered today include: *** No orders of the defined types were placed in this encounter.    Disposition:   FU with *** in {gen number [id number] {Days to years:10300}  Signed, Kathyrn Drown, NP  06/10/2019 8:39 AM    [facility] [place], [place], [place]  [address] Phone: [phone]; Fax: [phone]

## 2019-06-12 ENCOUNTER — Telehealth: Payer: Self-pay | Admitting: Cardiology

## 2019-06-12 NOTE — Telephone Encounter (Signed)
Spoke with patient and reviewed reason for follow-up appointment based on results of CT scan and start of new medications. Patient verbalized understanding. Patients appointment has been rescheduled.

## 2019-06-12 NOTE — Telephone Encounter (Signed)
Patient has an appt with Kathyrn Drown tomorrow but due to the weather it will have to be rescheduled. She wants to know if this appt is really necessary.

## 2019-06-13 ENCOUNTER — Ambulatory Visit: Payer: Medicare Other | Admitting: Cardiology

## 2019-06-28 NOTE — Progress Notes (Signed)
Cardiology Office Note   Date:  06/28/2019   ID:  Connie Weber, DOB [birth year], MRN [id number]  PCP:  Gaynelle Arabian, MD  Cardiologist: Dr. Fransico Him, MD  No chief complaint on file.    History of Present Illness: Connie Weber is a 80 y.o. female who presents for follow-up, seen for Dr. Radford Pax.  Connie Weber has a history of HTN, HLD, asthma, GERD and CKD stage III.on last office visit with Dr. Radford Pax 04/07/2019 she was having problems with DOE which she had attributed to possible weight gain.  She had gained about 10lbs due to being more sedentary with COVID but has lost some of this.  Also had reports of some chest discomfort. She had an episode when she was sitting in the pain clinic waiting room waiting for her husband and she develop a tightness around her chest under her breasts.  She tried to change positions but this did not help and finally unhooked her bra and felt better. She smoked for about 5 years and has no fm hx of CAD.  She was also having issues with labile BPs at which time she reported that her medications were recently decreased due to problems with hypotension.  Plans at that time were for coronary CT to more clearly define coronary anatomy as well as an echocardiogram given her episodes of dyspnea.  Echocardiogram showed normal LVEF with grade 1 DD, mild aortic sclerosis without stenosis and an ascending aortic aneurysm measuring 40 mm.  Plan was for repeat echocardiogram in 1 year for dilated ascending aorta.  Dr. Radford Pax preferred her to come in for BNP given the shortness of breath and noted to have diastolic dysfunction. ProBNP was found to be 96.  Coronary CTA performed which showed a coronary calcium score of 298 which is 72nd percentile for age and sex matched control.  There was moderate atherosclerosis of the ostial RCA, proximal to mid LAD and proximal LCx with normal coronary origin with right dominance.    FFR analysis demonstrated possibly flow-limiting  lesion in the diagonal branch and mid to distal LAD with recommendations for aggressive medical management and if continued chest discomfort then consider cardiac catheterization.   She returns today for follow-up.  She brings in extensive BP log which overall shows very well controlled BPs.  Continues to report exertional chest pain, occurring most often while she is cleaning her house.  Does state that her symptoms have improved with the addition of Imdur 15 mg p.o. daily however have not completely subsided.  We discussed proceeding with cardiac catheterization however will confirm with Dr. Radford Pax if she would like to monitor her symptoms with the increase of Imdur versus proceeding with cardiac catheterization.  She denies anginal symptoms today.  Has no shortness of breath, LE edema, orthopnea, PND, dizziness or syncope.   Past Medical History:  Diagnosis Date  . Arthritis   . Ascending aorta dilatation (HCC)    28mm by echo 2020  . Asthma   . Cancer ([place])    basal cell   . CKD (chronic kidney disease) stage 3, GFR 30-59 ml/min   . Complication of anesthesia    difficulty waking up, feels that it was due to not having an inhaler used prior to surgery  . GERD (gastroesophageal reflux disease)   . GERD without esophagitis   . H/O hiatal hernia   . Headache(784.0)    migraines as a young adult  . History of basal cell carcinoma  of skin   . Hypercholesteremia   . Hypertension   . Osteopenia   . Prediabetes     Past Surgical History:  Procedure Laterality Date  . ABDOMINAL HYSTERECTOMY    . APPENDECTOMY    . CHOLECYSTECTOMY    . COLONOSCOPY    . EYE SURGERY     cataract surgery x 2 and implants  . ORIF ANKLE FRACTURE Right 12/25/2012   Procedure: OPEN REDUCTION INTERNAL FIXATION (ORIF) RIGHT ANKLE FRACTURE;  Surgeon: Marianna Payment, MD;  Location: [facility];  Service: Orthopedics;  Laterality: Right;  . rotator cuff surgery Left 2013     Current Outpatient Medications    Medication Sig Dispense Refill  . albuterol (VENTOLIN HFA) 108 (90 Base) MCG/ACT inhaler Inhale 1-2 puffs into the lungs every 4 (four) hours as needed for wheezing or shortness of breath.    Marland Kitchen aspirin 325 MG tablet Take 1 tablet (325 mg total) by mouth 2 (two) times daily. (Patient taking differently: Take 325 mg by mouth 2 (two) times daily. As needed) 120 tablet 0  . atorvastatin (LIPITOR) 40 MG tablet Take 1 tablet (40 mg total) by mouth daily. 90 tablet 3  . BIOTIN 5000 PO Take by mouth daily.    . Calcium Carbonate-Vitamin D (CALTRATE 600+D) 600-400 MG-UNIT per tablet Take 1 tablet by mouth 2 (two) times daily.    . famotidine (PEPCID) 20 MG tablet Take 20 mg by mouth daily.    . Glucosamine-Chondroitin (GLUCOSAMINE CHONDR COMPLEX PO) Take 1 tablet by mouth daily.    . hydrochlorothiazide (MICROZIDE) 12.5 MG capsule Take 12.5 mg by mouth daily.    Marland Kitchen ibuprofen (ADVIL,MOTRIN) 200 MG tablet Take 400 mg by mouth every 6 (six) hours as needed for pain.    . isosorbide mononitrate (IMDUR) 30 MG 24 hr tablet Take 0.5 tablets (15 mg total) by mouth daily. 45 tablet 3  . metoprolol tartrate (LOPRESSOR) 100 MG tablet Take 1 tablet (100 mg total) by mouth once for 1 dose. Take 2 hours prior to CT. 1 tablet 0  . Multiple Vitamin (MULTIVITAMIN WITH MINERALS) TABS tablet Take 1 tablet by mouth daily.    . niacin 500 MG tablet Take 500 mg by mouth at bedtime.    . NON FORMULARY APPLE CIDER VINEGAR 1-2 TEASPOONS BY MOUTH MIXED IN WATER DAILY    . Omega-3 Fatty Acids (FISH OIL) 1000 MG CAPS Take by mouth 2 (two) times daily.    Marland Kitchen omeprazole (PRILOSEC) 20 MG capsule Take 40 mg by mouth daily.     Marland Kitchen oxyCODONE (OXY IR/ROXICODONE) 5 MG immediate release tablet Take 1-2 tablets (5-10 mg total) by mouth every 4 (four) hours as needed for pain. (Patient not taking: Reported on 04/23/2019) 90 tablet 0  . promethazine (PHENERGAN) 25 MG tablet Take 1 tablet (25 mg total) by mouth every 6 (six) hours as needed for  nausea. (Patient not taking: Reported on 04/23/2019) 60 tablet 0  . pseudoephedrine (PSUDATABS) 30 MG tablet Take 30 mg by mouth every 4 (four) hours as needed for congestion.    Marland Kitchen telmisartan (MICARDIS) 80 MG tablet Take 40 mg by mouth daily.     Marland Kitchen telmisartan-hydrochlorothiazide (MICARDIS HCT) 80-12.5 MG tablet 1/2 tablet Orally Once a day for blood pressure    . vitamin C (ASCORBIC ACID) 500 MG tablet Take 500 mg by mouth 2 (two) times daily.    Marland Kitchen zinc gluconate 50 MG tablet Take 1 tablet (50 mg total) by mouth daily. [place]  tablet 0   No current facility-administered medications for this visit.    Allergies:   Ace inhibitors, Actonel [risedronate sodium], Fosamax [alendronate sodium], Latex, and Prednisone    Social History:  The patient  reports that she has quit smoking. She has never used smokeless tobacco. She reports that she does not drink alcohol or use drugs.   Family History:  The patient's family history includes Breast cancer in her sister, sister, and sister.    ROS:  Please see the history of present illness. Otherwise, review of systems are positive for none.   All other systems are reviewed and negative.    PHYSICAL EXAM: VS:  There were no vitals taken for this visit. , BMI There is no height or weight on file to calculate BMI.   General: Well developed, well nourished, NAD Head: Normocephalic, atraumatic, sclera non-icteric, no xanthomas, clear, moist mucus membranes. Neck: Negative for carotid bruits. No JVD Lungs:Clear to ausculation bilaterally. No wheezes, rales, or rhonchi. Breathing is unlabored. Cardiovascular: RRR with S1 S2. No murmurs, rubs, gallops, or LV heave appreciated. Extremities: No edema. Radial pulses 2+ bilaterally Neuro: Alert and oriented. No focal deficits. No facial asymmetry. MAE spontaneously. Psych: Responds to questions appropriately with normal affect.     EKG:  EKG is not ordered today.  Recent Labs: 04/22/2019: NT-Pro BNP  96 05/08/2019: BUN 15; Creatinine, Ser 0.98; Potassium 3.8; Sodium 141    Lipid Panel No results found for: CHOL, TRIG, HDL, CHOLHDL, VLDL, LDLCALC, LDLDIRECT    Wt Readings from Last 3 Encounters:  04/07/19 186 lb 9.6 oz (84.6 kg)  12/24/12 175 lb (79.4 kg)      Other studies Reviewed: Additional studies/ records that were reviewed today include: .  Coronary CTA 05/28/2019:   1. Left Main: No significant stenosis. LM FFR = 0.96.  2. LAD: Possible flow limiting lesion in distal LAD. Proximal FFR = 0.95, Mid FFR = 0.82, Distal FFR = 0.78.  3. LCX: No significant stenosis. Proximal FFR = 0.97, Mid FFR = 0.95, Distal FFR = 0.91.  4. RCA: No significant stenosis. Proximal FFR = 0.97, Mid FFR = 0.96, Distal FFR = 0.94.  IMPRESSION: 1. CT FFR flow analysis demonstrates a possibly flow limiting lesion in the Diagonal branch and mid to distal LAD.  2. Recommend aggressive medical management and if continued Chest pain then consider cardiac cath.  Echocardiogram 04/15/2019:  1. Left ventricular ejection fraction, by visual estimation, is 65 to  70%. The left ventricle has hyperdynamic function. There is mildly  increased left ventricular hypertrophy.  2. Left ventricular diastolic parameters are consistent with Grade I  diastolic dysfunction (impaired relaxation).  3. The left ventricle has no regional wall motion abnormalities.  4. Global right ventricle has normal systolic function.The right  ventricular size is normal. No increase in right ventricular wall  thickness.  5. Left atrial size was normal.  6. Right atrial size was normal.  7. The mitral valve is normal in structure. Trivial mitral valve  regurgitation. No evidence of mitral stenosis.  8. The tricuspid valve is normal in structure. Tricuspid valve  regurgitation is not demonstrated.  9. The aortic valve is normal in structure. Aortic valve regurgitation is  not visualized. Mild aortic valve  sclerosis without stenosis.  10. The pulmonic valve was normal in structure. Pulmonic valve  regurgitation is not visualized.  11. Aneurysm of the ascending aorta, measuring 40 mm.  12. The inferior vena cava is normal in size with greater  than 50%  respiratory variability, suggesting right atrial pressure of 3 mmHg.    ASSESSMENT AND PLAN:  1.  Chest pain with presumed nonobstructive CAD per coronary CTA: -Coronary CTA performed with FFR which demonstrated possibly flow-limiting lesion in the diagonal branch and mid to distal LAD with recommendations for aggressive medical management and if continued chest pain then consider cardiac catheterization -Continues to have exertional anginal symptoms with cleaning her house however improved with the addition of low-dose Imdur.  We will plan to possibly proceed with cardiac catheterization given persistent symptoms however will confirm with Dr. Radford Pax -Increase Imdur to 30 mg p.o. daily -Add ASA 81  2.  Shortness of breath: -Echocardiogram with normal LV function and G1 DD however there is evidence of dilated ascending aorta measuring 40 mm therefore subsequent echocardiogram in 1 year will need to be performed -If continued shortness of breath, consider LHC given coronary CTA and FFR results as above -Reports improvement in symptoms  3.  HTN: -Has whitecoat syndrome>>> very well controlled per home BP logs -Continue current regimen   Current medicines are reviewed at length with the patient today.  The patient does not have concerns regarding medicines.  The following changes have been made: Increase Imdur to 30 mg p.o. daily  Labs/ tests ordered today include:None  No orders of the defined types were placed in this encounter.  Disposition:   FU with Dr. Radford Pax or APP in 4 weeks  Signed, Kathyrn Drown, NP  06/28/2019 [facility] [place], [place], [place]  [address] Phone: [phone]; Fax:  [phone]

## 2019-07-02 ENCOUNTER — Ambulatory Visit (INDEPENDENT_AMBULATORY_CARE_PROVIDER_SITE_OTHER): Payer: Medicare Other | Admitting: Cardiology

## 2019-07-02 ENCOUNTER — Telehealth: Payer: Self-pay

## 2019-07-02 ENCOUNTER — Other Ambulatory Visit: Payer: Self-pay

## 2019-07-02 ENCOUNTER — Other Ambulatory Visit: Payer: Medicare Other | Admitting: *Deleted

## 2019-07-02 ENCOUNTER — Encounter: Payer: Self-pay | Admitting: Cardiology

## 2019-07-02 VITALS — BP 142/68 | HR 82 | Ht 62.0 in | Wt 185.0 lb

## 2019-07-02 DIAGNOSIS — R079 Chest pain, unspecified: Secondary | ICD-10-CM

## 2019-07-02 DIAGNOSIS — I25118 Atherosclerotic heart disease of native coronary artery with other forms of angina pectoris: Secondary | ICD-10-CM | POA: Diagnosis not present

## 2019-07-02 DIAGNOSIS — R0602 Shortness of breath: Secondary | ICD-10-CM | POA: Diagnosis not present

## 2019-07-02 DIAGNOSIS — E7800 Pure hypercholesterolemia, unspecified: Secondary | ICD-10-CM | POA: Diagnosis not present

## 2019-07-02 DIAGNOSIS — I10 Essential (primary) hypertension: Secondary | ICD-10-CM | POA: Diagnosis not present

## 2019-07-02 LAB — LIPID PANEL
Chol/HDL Ratio: 3.2 ratio (ref 0.0–4.4)
Cholesterol, Total: 150 mg/dL (ref 100–199)
HDL: 47 mg/dL (ref >39)
LDL Chol Calc (NIH): 81 mg/dL (ref 0–99)
Triglycerides: 120 mg/dL (ref 0–149)
VLDL Cholesterol Cal: 22 mg/dL (ref 5–40)

## 2019-07-02 LAB — ALT: ALT: 15 IU/L (ref 0–32)

## 2019-07-02 MED ORDER — ASPIRIN 81 MG PO TBEC: 81.0000 mg | DELAYED_RELEASE_TABLET | Freq: Every day | ORAL | 12 refills | Status: AC

## 2019-07-02 MED ORDER — ISOSORBIDE MONONITRATE ER 30 MG PO TB24: 30.0000 mg | ORAL_TABLET | Freq: Every day | ORAL | 1 refills | Status: DC

## 2019-07-02 NOTE — Telephone Encounter (Signed)
I called and left patient a message letting her know that per Dr. Radford Pax, continue increased dose of Imdur to see if this helps with chest pain, and to give the medication 2-3 weeks at the increased dose. Advised patient to call if after 2-3 weeks the chest pain has not gotten better. Patient will keep follow up with Dr. Radford Pax.

## 2019-07-02 NOTE — Patient Instructions (Addendum)
Medication Instructions:   Your physician has recommended you make the following change in your medication:   1) Increase Imdur to 30 mg, 1 tablet by mouth once a day 2) Decrease Aspirin to 81 mg, 1 tablet by mouth once a day  *If you need a refill on your cardiac medications before your next appointment, please call your pharmacy*  Lab Work:  None ordered today  If you have labs (blood work) drawn today and your tests are completely normal, you will receive your results only by: Marland Kitchen MyChart Message (if you have MyChart) OR . A paper copy in the mail If you have any lab test that is abnormal or we need to change your treatment, we will call you to review the results.  Testing/Procedures:  None ordered today  Follow-Up: At [facility], you and your health needs are our priority.  As part of our continuing mission to provide you with exceptional heart care, we have created designated Provider Care Teams.  These Care Teams include your primary Cardiologist (physician) and Advanced Practice Providers (APPs -  Physician Assistants and Nurse Practitioners) who all work together to provide you with the care you need, when you need it.  We recommend signing up for the patient portal called "MyChart".  Sign up information is provided on this After Visit Summary.  MyChart is used to connect with patients for Virtual Visits (Telemedicine).  Patients are able to view lab/test results, encounter notes, upcoming appointments, etc.  Non-urgent messages can be sent to your provider as well.   To learn more about what you can do with MyChart, go to [email or web address].    Your next appointment:    On 08/12/19 at 11:00AM with Fransico Him, MD

## 2019-07-02 NOTE — Telephone Encounter (Signed)
-----   Message from Tommie Raymond, NP sent at 07/02/2019  1:19 PM EST ----- Please let the patient know that we will continue with the increased Imdur for now and follow-up with Dr. Radford Pax as previously scheduled.  If chest pain symptoms continue we will plan on LHC at that time. ----- Message ----- From: Sueanne Margarita, MD Sent: 07/02/2019  11:21 AM EST To: Tommie Raymond, NP  Titrate Imdur as much as BP will allow and if still having CP then needs cath  Traci ----- Message ----- From: Tommie Raymond, NP Sent: 07/02/2019  11:18 AM EST To: Sueanne Margarita, MD  Dr. Radford Pax Would you prefer to monitor this patient's symptoms with the increase of Imdur versus proceeding with LHC?

## 2019-07-03 ENCOUNTER — Telehealth: Payer: Self-pay

## 2019-07-03 DIAGNOSIS — E7800 Pure hypercholesterolemia, unspecified: Secondary | ICD-10-CM

## 2019-07-03 MED ORDER — ATORVASTATIN CALCIUM 80 MG PO TABS: 80.0000 mg | ORAL_TABLET | Freq: Every day | ORAL | 3 refills | Status: DC

## 2019-07-03 NOTE — Telephone Encounter (Signed)
-----   Message from Sueanne Margarita, MD sent at 07/02/2019  8:51 PM EST ----- LDL not at goal - increase atorvastatin to 80mg  daily and repeat FLP and ALT in 6 weeks

## 2019-07-03 NOTE — Telephone Encounter (Signed)
The patient has been notified of the result and verbalized understanding.  All questions (if any) were answered. Patient will increase atorvastatin to 80 mg daily. Will repeat FLP and ALT on same day as appointment with Dr. Radford Pax 4/20.  Antonieta Iba, RN 07/03/2019 11:02 AM

## 2019-07-07 ENCOUNTER — Other Ambulatory Visit: Payer: Medicare Other

## 2019-07-22 ENCOUNTER — Other Ambulatory Visit: Payer: Self-pay

## 2019-07-22 MED ORDER — ATORVASTATIN CALCIUM 80 MG PO TABS: 80.0000 mg | ORAL_TABLET | Freq: Every day | ORAL | 3 refills | Status: DC

## 2019-07-22 NOTE — Telephone Encounter (Signed)
Pt's medication was sent to pt's pharmacy as requested. Confirmation received.  °

## 2019-08-11 ENCOUNTER — Telehealth: Payer: Self-pay | Admitting: Cardiology

## 2019-08-11 NOTE — Telephone Encounter (Signed)
Spoke with the patient and advised her that it would be okay for her husband to come up to her appointment with her tomorrow.

## 2019-08-11 NOTE — Telephone Encounter (Signed)
New Message  Patient is calling to get husband Sharday Dimitroff) approved to come in with her to her appointment with Dr. Radford Pax on 08/12/19. States that it is medically necessary for him to be with her. Please give patient a call back to confirm.

## 2019-08-12 ENCOUNTER — Other Ambulatory Visit: Payer: Medicare Other

## 2019-08-12 ENCOUNTER — Other Ambulatory Visit: Payer: Self-pay | Admitting: Cardiology

## 2019-08-12 ENCOUNTER — Ambulatory Visit (INDEPENDENT_AMBULATORY_CARE_PROVIDER_SITE_OTHER): Payer: Medicare Other | Admitting: Cardiology

## 2019-08-12 ENCOUNTER — Encounter: Payer: Self-pay | Admitting: Cardiology

## 2019-08-12 ENCOUNTER — Other Ambulatory Visit: Payer: Self-pay

## 2019-08-12 VITALS — BP 138/76 | HR 80 | Ht 62.0 in | Wt 187.8 lb

## 2019-08-12 DIAGNOSIS — I10 Essential (primary) hypertension: Secondary | ICD-10-CM

## 2019-08-12 DIAGNOSIS — E7800 Pure hypercholesterolemia, unspecified: Secondary | ICD-10-CM

## 2019-08-12 DIAGNOSIS — I25118 Atherosclerotic heart disease of native coronary artery with other forms of angina pectoris: Secondary | ICD-10-CM

## 2019-08-12 DIAGNOSIS — R079 Chest pain, unspecified: Secondary | ICD-10-CM

## 2019-08-12 LAB — LIPID PANEL
Chol/HDL Ratio: 2.9 ratio (ref 0.0–4.4)
Cholesterol, Total: 141 mg/dL (ref 100–199)
HDL: 49 mg/dL (ref >39)
LDL Chol Calc (NIH): 72 mg/dL (ref 0–99)
Triglycerides: 113 mg/dL (ref 0–149)
VLDL Cholesterol Cal: 20 mg/dL (ref 5–40)

## 2019-08-12 LAB — ALT: ALT: 20 IU/L (ref 0–32)

## 2019-08-12 MED ORDER — METOPROLOL TARTRATE 25 MG PO TABS: 25.0000 mg | ORAL_TABLET | Freq: Two times a day (BID) | ORAL | 3 refills | Status: DC

## 2019-08-12 NOTE — Patient Instructions (Signed)
Medication Instructions:  Your physician has recommended you make the following change in your medication:  1) START taking metoprolol 25 mg daily.   *If you need a refill on your cardiac medications before your next appointment, please call your pharmacy*   Lab Work: TODAY: BMET, CBC If you have labs (blood work) drawn today and your tests are completely normal, you will receive your results only by: Marland Kitchen MyChart Message (if you have MyChart) OR . A paper copy in the mail If you have any lab test that is abnormal or we need to change your treatment, we will call you to review the results.   Testing/Procedures: Your physician has requested that you have a cardiac catheterization. Cardiac catheterization is used to diagnose and/or treat various heart conditions. Doctors may recommend this procedure for a number of different reasons. The most common reason is to evaluate chest pain. Chest pain can be a symptom of coronary artery disease (CAD), and cardiac catheterization can show whether plaque is narrowing or blocking your heart's arteries. This procedure is also used to evaluate the valves, as well as measure the blood flow and oxygen levels in different parts of your heart. For further information please visit [email or web address]. Please follow instruction sheet, as given.   Follow-Up: At [facility], you and your health needs are our priority.  As part of our continuing mission to provide you with exceptional heart care, we have created designated Provider Care Teams.  These Care Teams include your primary Cardiologist (physician) and Advanced Practice Providers (APPs -  Physician Assistants and Nurse Practitioners) who all work together to provide you with the care you need, when you need it.  We recommend signing up for the patient portal called "MyChart".  Sign up information is provided on this After Visit Summary.  MyChart is used to connect with patients for Virtual Visits  (Telemedicine).  Patients are able to view lab/test results, encounter notes, upcoming appointments, etc.  Non-urgent messages can be sent to your provider as well.   To learn more about what you can do with MyChart, go to [email or web address].    Your next appointment:   6 month(s)  The format for your next appointment:   In Person  Provider:   You may see Fransico Him, MD or one of the following Advanced Practice Providers on your designated Care Team:    Melina Copa, [place]-  Ermalinda Barrios, PA-C   Other Instructions    [place] OFFICE [place], [place] [address] Dept: [place]: [place]  08/12/2019  You are scheduled for a Cardiac Catheterization on Friday, April 30 with Dr. Daneen Schick.  1. Please arrive at the [facility] (Main Entrance A) at [facility]: [address] [place], [place] [address] at 8:30 AM (This time is two hours before your procedure to ensure your preparation). Free valet parking service is available.   Special note: Every effort is made to have your procedure done on time. Please understand that emergencies sometimes delay scheduled procedures.  2. Diet: Do not eat solid foods after midnight.  The patient may have clear liquids until 5am upon the day of the procedure.  3. Labs: You will need to have blood drawn on Tuesday, April 20 at [facility]. [address]  Open: 7:30am - 5pm    Phone: [phone]. You do not  need to be fasting.  4. Medication instructions in preparation for your procedure:   Contrast Allergy: No    Current Outpatient Medications (Cardiovascular):  .  atorvastatin (LIPITOR) 80 MG tablet, Take 1 tablet (80 mg total) by mouth daily. .  hydrochlorothiazide (MICROZIDE) 12.5 MG capsule, Take 12.5 mg by mouth daily. .  isosorbide mononitrate (IMDUR) 30 MG  24 hr tablet, Take 1 tablet (30 mg total) by mouth daily. Marland Kitchen  telmisartan (MICARDIS) 80 MG tablet, Take 40 mg by mouth daily.   Current Outpatient Medications (Respiratory):  .  albuterol (VENTOLIN HFA) 108 (90 Base) MCG/ACT inhaler, Inhale 1-2 puffs into the lungs every 4 (four) hours as needed for wheezing or shortness of breath. .  pseudoephedrine (PSUDATABS) 30 MG tablet, Take 30 mg by mouth every 4 (four) hours as needed for congestion.  Current Outpatient Medications (Analgesics):  .  aspirin 81 MG EC tablet, Take 1 tablet (81 mg total) by mouth daily. Swallow whole. .  ibuprofen (ADVIL,MOTRIN) 200 MG tablet, Take 400 mg by mouth every 6 (six) hours as needed for pain.   Current Outpatient Medications (Other):  .  BIOTIN 5000 PO, Take by mouth daily. .  Calcium Carbonate-Vitamin D (CALTRATE 600+D) 600-400 MG-UNIT per tablet, Take 1 tablet by mouth 2 (two) times daily. .  Glucosamine-Chondroitin (GLUCOSAMINE CHONDR COMPLEX PO), Take 1 tablet by mouth daily. .  Multiple Vitamin (MULTIVITAMIN WITH MINERALS) TABS tablet, Take 1 tablet by mouth daily. .  niacin 500 MG tablet, Take 500 mg by mouth at bedtime. .  NON FORMULARY, APPLE CIDER VINEGAR 1-2 TEASPOONS BY MOUTH MIXED IN WATER DAILY .  omeprazole (PRILOSEC) 40 MG capsule, Take 40 mg by mouth daily. .  vitamin C (ASCORBIC ACID) 500 MG tablet, Take 500 mg by mouth 2 (two) times daily. .  Wheat Dextrin (EQ FIBER POWDER PO), Take by mouth. .  zinc gluconate 50 MG tablet, Take 1 tablet (50 mg total) by mouth daily. *For reference purposes while preparing patient instructions.   Delete this med list prior to printing instructions for patient.*  Stop taking, HTCZ (Hydrochlorothiazide) Friday, April 30,  On the morning of your procedure, take your Aspirin and any morning medicines NOT listed above.  You may use sips of water.  5. Plan for one night stay--bring personal belongings. 6. Bring a current list of your medications and current  insurance cards. 7. You MUST have a responsible person to drive you home. 8. Someone MUST be with you the first 24 hours after you arrive home or your discharge will be delayed. 9. Please wear clothes that are easy to get on and off and wear slip-on shoes.  Thank you for allowing [place] to care for you!   -- [place] [facility]

## 2019-08-12 NOTE — Progress Notes (Signed)
Cardiology Office Note:    Date:  08/12/2019   ID:  [name], DOB [birth year], MRN [id number]  PCP:  Gaynelle Arabian, MD  Cardiologist:  Fransico Him, MD    Referring MD: Gaynelle Arabian, MD   Chief Complaint  Patient presents with  . Coronary Artery Disease  . Hypertension  . Hyperlipidemia    History of Present Illness:    Connie Weber is a 79 y.o. female with a hx of HTN, HLD, asthma, GERD and CKD stage III, ascending aortic aneurysm measuring 40 mm and CAD with Coronary CTA showing a coronary calcium score of 298 with  moderate atherosclerosis of the ostial RCA, proximal to mid LAD and proximal LCx with normal coronary origin with right dominance.  FFR analysis demonstrated possibly flow-limiting lesion in the diagonal branch and mid to distal LAD with recommendations for aggressive medical management and if continued chest discomfort then consider cardiac catheterization.    She was seen last month by Kathyrn Drown, NP and continued to complain of chest pain with cleaning her house but had improved with addition of low dose long acting nitrate.  Her Imdur was increased to 30mg  daily with plans for early followup and if CP and SOB continued then proceed with cath.    She is here today for followup.    Despite increasing the long acting nitrate, she continues to have DOE and exertional chest pain when doing any exertional activities.  She think that going up on the Imdur may have helped a little but not much.  She denies any PND, orthopnea, LE edema, dizziness, palpitations or syncope. She is compliant with her meds and is tolerating meds with no SE.    Past Medical History:  Diagnosis Date  . Arthritis   . Ascending aorta dilatation (HCC)    56mm by echo 2020  . Asthma   . Cancer ([place])    basal cell   . CKD (chronic kidney disease) stage 3, GFR 30-59 ml/min   . Complication of anesthesia    difficulty waking up, feels that it was due to not having an inhaler used prior to  surgery  . GERD (gastroesophageal reflux disease)   . GERD without esophagitis   . H/O hiatal hernia   . Headache(784.0)    migraines as a young adult  . History of basal cell carcinoma of skin   . Hypercholesteremia   . Hypertension   . Osteopenia   . Prediabetes     Past Surgical History:  Procedure Laterality Date  . ABDOMINAL HYSTERECTOMY    . APPENDECTOMY    . CHOLECYSTECTOMY    . COLONOSCOPY    . EYE SURGERY     cataract surgery x 2 and implants  . ORIF ANKLE FRACTURE Right 12/25/2012   Procedure: OPEN REDUCTION INTERNAL FIXATION (ORIF) RIGHT ANKLE FRACTURE;  Surgeon: Marianna Payment, MD;  Location: [place];  Service: Orthopedics;  Laterality: Right;  . rotator cuff surgery Left 2013    Current Medications: Current Meds  Medication Sig  . albuterol (VENTOLIN HFA) 108 (90 Base) MCG/ACT inhaler Inhale 1-2 puffs into the lungs every 4 (four) hours as needed for wheezing or shortness of breath.  Marland Kitchen aspirin 81 MG EC tablet Take 1 tablet (81 mg total) by mouth daily. Swallow whole.  Marland Kitchen atorvastatin (LIPITOR) 80 MG tablet Take 1 tablet (80 mg total) by mouth daily.  Marland Kitchen BIOTIN 5000 PO Take by mouth daily.  . Calcium Carbonate-Vitamin D (CALTRATE 600+D) 600-400  MG-UNIT per tablet Take 1 tablet by mouth 2 (two) times daily.  . Glucosamine-Chondroitin (GLUCOSAMINE CHONDR COMPLEX PO) Take 1 tablet by mouth daily.  . hydrochlorothiazide (MICROZIDE) 12.5 MG capsule Take 12.5 mg by mouth daily.  Marland Kitchen ibuprofen (ADVIL,MOTRIN) 200 MG tablet Take 400 mg by mouth every 6 (six) hours as needed for pain.  . isosorbide mononitrate (IMDUR) 30 MG 24 hr tablet Take 1 tablet (30 mg total) by mouth daily.  . Multiple Vitamin (MULTIVITAMIN WITH MINERALS) TABS tablet Take 1 tablet by mouth daily.  . niacin 500 MG tablet Take 500 mg by mouth at bedtime.  . NON FORMULARY APPLE CIDER VINEGAR 1-2 TEASPOONS BY MOUTH MIXED IN WATER DAILY  . omeprazole (PRILOSEC) 40 MG capsule Take 40 mg by mouth daily.  .  pseudoephedrine (PSUDATABS) 30 MG tablet Take 30 mg by mouth every 4 (four) hours as needed for congestion.  Marland Kitchen telmisartan (MICARDIS) 80 MG tablet Take 40 mg by mouth daily.   . vitamin C (ASCORBIC ACID) 500 MG tablet Take 500 mg by mouth 2 (two) times daily.  . Wheat Dextrin (EQ FIBER POWDER PO) Take by mouth.  . zinc gluconate 50 MG tablet Take 1 tablet (50 mg total) by mouth daily.     Allergies:   Ace inhibitors, Actonel [risedronate sodium], Alendronate, Atorvastatin, Fosamax [alendronate sodium], Latex, Prednisone, and Risedronate   Social History   Socioeconomic History  . Marital status: Married    Spouse name: Not on file  . Number of children: Not on file  . Years of education: Not on file  . Highest education level: Not on file  Occupational History  . Not on file  Tobacco Use  . Smoking status: Former Research scientist (life sciences)  . Smokeless tobacco: Never Used  Substance and Sexual Activity  . Alcohol use: No  . Drug use: No  . Sexual activity: Not on file  Other Topics Concern  . Not on file  Social History Narrative  . Not on file   Social Determinants of Health   Financial Resource Strain:   . Difficulty of Paying Living Expenses:   Food Insecurity:   . Worried About Charity fundraiser in the Last Year:   . Arboriculturist in the Last Year:   Transportation Needs:   . Film/video editor (Medical):   Marland Kitchen Lack of Transportation (Non-Medical):   Physical Activity:   . Days of Exercise per Week:   . Minutes of Exercise per Session:   Stress:   . Feeling of Stress :   Social Connections:   . Frequency of Communication with Friends and Family:   . Frequency of Social Gatherings with Friends and Family:   . Attends Religious Services:   . Active Member of Clubs or Organizations:   . Attends Archivist Meetings:   Marland Kitchen Marital Status:      Family History: The patient's family history includes Breast cancer in her sister, sister, and sister.  ROS:   Please see  the history of present illness.    ROS  All other systems reviewed and negative.   EKGs/Labs/Other Studies Reviewed:    The following studies were reviewed today: Coronary CTA, OV notes  EKG:  EKG is not ordered today.    Recent Labs: 04/22/2019: NT-Pro BNP 96 05/08/2019: BUN 15; Creatinine, Ser 0.98; Potassium 3.8; Sodium 141 07/02/2019: ALT 15   Recent Lipid Panel    Component Value Date/Time   CHOL 150 07/02/2019 1123  TRIG 120 07/02/2019 1123   HDL 47 07/02/2019 1123   CHOLHDL 3.2 07/02/2019 1123   LDLCALC 81 07/02/2019 1123    Physical Exam:    VS:  BP 138/76   Pulse 80   Ht 5\' 2"  (1.575 m)   Wt 187 lb 12.8 oz (85.2 kg)   SpO2 95%   BMI 34.35 kg/m     Wt Readings from Last 3 Encounters:  08/12/19 187 lb 12.8 oz (85.2 kg)  07/02/19 185 lb (83.9 kg)  04/07/19 186 lb 9.6 oz (84.6 kg)     GEN:  Well nourished, well developed in no acute distress HEENT: Normal NECK: No JVD; No carotid bruits LYMPHATICS: No lymphadenopathy CARDIAC: RRR, no murmurs, rubs, gallops RESPIRATORY:  Clear to auscultation without rales, wheezing or rhonchi  ABDOMEN: Soft, non-tender, non-distended MUSCULOSKELETAL:  No edema; No deformity  SKIN: Warm and dry NEUROLOGIC:  Alert and oriented x 3 PSYCHIATRIC:  Normal affect   ASSESSMENT:    1. Chest pain of uncertain etiology   2. Coronary artery disease of native artery of native heart with stable angina pectoris ([place])   3. Essential hypertension   4. Pure hypercholesterolemia    PLAN:    In order of problems listed above:  1.  Chest pain -pain slightly improved on low dose nitrates at last OV and was increased to 30mg  daily but really has not changed her exertional angina -today she tells me that she gets chest tightness and SOB with any housework she does -I reviewed the findings of the coronary CTA and echo with her and her husband today which showed borderline obstructive disease of 50-69% in LAD, LCx and RCA -I have  recommended add Toprol XL 25mg  daily -I have also recommended that we proceed with cardiac cath to further define coronary anatomy and assess filling pressures -Cardiac catheterization was discussed with the patient fully. The patient understands that risks include but are not limited to stroke (1 in 1000), death (1 in 18), kidney failure [usually temporary] (1 in 500), bleeding (1 in 200), allergic reaction [possibly serious] (1 in 200).  The patient understands and is willing to proceed.     2.  ASCAD -Coronary CTA showing a coronary calcium score of 298 with  moderate atherosclerosis of the ostial RCA, proximal to mid LAD and proximal LCx with normal coronary origin with right dominance.  FFR analysis demonstrated possibly flow-limiting lesion in the diagonal branch and mid to distal LAD -continue ASA, statin  3.  HTN -BP controlled on exam today -continue HCTZ 12.5mg  daily. Imdur 30mg  daily, Telmisartan 40mg  daily -creatinie was 0.98 in Jan 2021 and K+ 3.8  4.  HLD -LDL goal < 70 -LDL was 81 in march -Atorvastatin was increased to 80mg  daily -FLp and ALT were repeated today and are pending   Medication Adjustments/Labs and Tests Ordered: Current medicines are reviewed at length with the patient today.  Concerns regarding medicines are outlined above.  No orders of the defined types were placed in this encounter.  No orders of the defined types were placed in this encounter.   Signed, Fransico Him, MD  08/12/2019 11:10 AM    [place]

## 2019-08-12 NOTE — H&P (View-Only) (Signed)
Cardiology Office Note:    Date:  08/12/2019   ID:  Connie Weber, DOB [birth year], MRN [id number]  PCP:  Gaynelle Arabian, MD  Cardiologist:  Fransico Him, MD    Referring MD: Gaynelle Arabian, MD   Chief Complaint  Patient presents with  . Coronary Artery Disease  . Hypertension  . Hyperlipidemia    History of Present Illness:    CAMBRA ADWELL is a 79 y.o. female with a hx of HTN, HLD, asthma, GERD and CKD stage III, ascending aortic aneurysm measuring 40 mm and CAD with Coronary CTA showing a coronary calcium score of 298 with  moderate atherosclerosis of the ostial RCA, proximal to mid LAD and proximal LCx with normal coronary origin with right dominance.  FFR analysis demonstrated possibly flow-limiting lesion in the diagonal branch and mid to distal LAD with recommendations for aggressive medical management and if continued chest discomfort then consider cardiac catheterization.    She was seen last month by Kathyrn Drown, NP and continued to complain of chest pain with cleaning her house but had improved with addition of low dose long acting nitrate.  Her Imdur was increased to 30mg  daily with plans for early followup and if CP and SOB continued then proceed with cath.    She is here today for followup.    Despite increasing the long acting nitrate, she continues to have DOE and exertional chest pain when doing any exertional activities.  She think that going up on the Imdur may have helped a little but not much.  She denies any PND, orthopnea, LE edema, dizziness, palpitations or syncope. She is compliant with her meds and is tolerating meds with no SE.    Past Medical History:  Diagnosis Date  . Arthritis   . Ascending aorta dilatation (HCC)    40mm by echo 2020  . Asthma   . Cancer ([place])    basal cell   . CKD (chronic kidney disease) stage 3, GFR 30-59 ml/min   . Complication of anesthesia    difficulty waking up, feels that it was due to not having an inhaler used prior to  surgery  . GERD (gastroesophageal reflux disease)   . GERD without esophagitis   . H/O hiatal hernia   . Headache(784.0)    migraines as a young adult  . History of basal cell carcinoma of skin   . Hypercholesteremia   . Hypertension   . Osteopenia   . Prediabetes     Past Surgical History:  Procedure Laterality Date  . ABDOMINAL HYSTERECTOMY    . APPENDECTOMY    . CHOLECYSTECTOMY    . COLONOSCOPY    . EYE SURGERY     cataract surgery x 2 and implants  . ORIF ANKLE FRACTURE Right 12/25/2012   Procedure: OPEN REDUCTION INTERNAL FIXATION (ORIF) RIGHT ANKLE FRACTURE;  Surgeon: Marianna Payment, MD;  Location: [facility];  Service: Orthopedics;  Laterality: Right;  . rotator cuff surgery Left 2013    Current Medications: Current Meds  Medication Sig  . albuterol (VENTOLIN HFA) 108 (90 Base) MCG/ACT inhaler Inhale 1-2 puffs into the lungs every 4 (four) hours as needed for wheezing or shortness of breath.  Marland Kitchen aspirin 81 MG EC tablet Take 1 tablet (81 mg total) by mouth daily. Swallow whole.  Marland Kitchen atorvastatin (LIPITOR) 80 MG tablet Take 1 tablet (80 mg total) by mouth daily.  Marland Kitchen BIOTIN 5000 PO Take by mouth daily.  . Calcium Carbonate-Vitamin D (CALTRATE 600+D) 600-400  MG-UNIT per tablet Take 1 tablet by mouth 2 (two) times daily.  . Glucosamine-Chondroitin (GLUCOSAMINE CHONDR COMPLEX PO) Take 1 tablet by mouth daily.  . hydrochlorothiazide (MICROZIDE) 12.5 MG capsule Take 12.5 mg by mouth daily.  Marland Kitchen ibuprofen (ADVIL,MOTRIN) 200 MG tablet Take 400 mg by mouth every 6 (six) hours as needed for pain.  . isosorbide mononitrate (IMDUR) 30 MG 24 hr tablet Take 1 tablet (30 mg total) by mouth daily.  . Multiple Vitamin (MULTIVITAMIN WITH MINERALS) TABS tablet Take 1 tablet by mouth daily.  . niacin 500 MG tablet Take 500 mg by mouth at bedtime.  . NON FORMULARY APPLE CIDER VINEGAR 1-2 TEASPOONS BY MOUTH MIXED IN WATER DAILY  . omeprazole (PRILOSEC) 40 MG capsule Take 40 mg by mouth daily.  .  pseudoephedrine (PSUDATABS) 30 MG tablet Take 30 mg by mouth every 4 (four) hours as needed for congestion.  Marland Kitchen telmisartan (MICARDIS) 80 MG tablet Take 40 mg by mouth daily.   . vitamin C (ASCORBIC ACID) 500 MG tablet Take 500 mg by mouth 2 (two) times daily.  . Wheat Dextrin (EQ FIBER POWDER PO) Take by mouth.  . zinc gluconate 50 MG tablet Take 1 tablet (50 mg total) by mouth daily.     Allergies:   Ace inhibitors, Actonel [risedronate sodium], Alendronate, Atorvastatin, Fosamax [alendronate sodium], Latex, Prednisone, and Risedronate   Social History   Socioeconomic History  . Marital status: Married    Spouse name: Not on file  . Number of children: Not on file  . Years of education: Not on file  . Highest education level: Not on file  Occupational History  . Not on file  Tobacco Use  . Smoking status: Former Research scientist (life sciences)  . Smokeless tobacco: Never Used  Substance and Sexual Activity  . Alcohol use: No  . Drug use: No  . Sexual activity: Not on file  Other Topics Concern  . Not on file  Social History Narrative  . Not on file   Social Determinants of Health   Financial Resource Strain:   . Difficulty of Paying Living Expenses:   Food Insecurity:   . Worried About Charity fundraiser in the Last Year:   . Arboriculturist in the Last Year:   Transportation Needs:   . Film/video editor (Medical):   Marland Kitchen Lack of Transportation (Non-Medical):   Physical Activity:   . Days of Exercise per Week:   . Minutes of Exercise per Session:   Stress:   . Feeling of Stress :   Social Connections:   . Frequency of Communication with Friends and Family:   . Frequency of Social Gatherings with Friends and Family:   . Attends Religious Services:   . Active Member of Clubs or Organizations:   . Attends Archivist Meetings:   Marland Kitchen Marital Status:      Family History: The patient's family history includes Breast cancer in her sister, sister, and sister.  ROS:   Please see  the history of present illness.    ROS  All other systems reviewed and negative.   EKGs/Labs/Other Studies Reviewed:    The following studies were reviewed today: Coronary CTA, OV notes  EKG:  EKG is not ordered today.    Recent Labs: 04/22/2019: NT-Pro BNP 96 05/08/2019: BUN 15; Creatinine, Ser 0.98; Potassium 3.8; Sodium 141 07/02/2019: ALT 15   Recent Lipid Panel    Component Value Date/Time   CHOL 150 07/02/2019 1123  TRIG 120 07/02/2019 1123   HDL 47 07/02/2019 1123   CHOLHDL 3.2 07/02/2019 1123   LDLCALC 81 07/02/2019 1123    Physical Exam:    VS:  BP 138/76   Pulse 80   Ht 5\' 2"  (1.575 m)   Wt 187 lb 12.8 oz (85.2 kg)   SpO2 95%   BMI 34.35 kg/m     Wt Readings from Last 3 Encounters:  08/12/19 187 lb 12.8 oz (85.2 kg)  07/02/19 185 lb (83.9 kg)  04/07/19 186 lb 9.6 oz (84.6 kg)     GEN:  Well nourished, well developed in no acute distress HEENT: Normal NECK: No JVD; No carotid bruits LYMPHATICS: No lymphadenopathy CARDIAC: RRR, no murmurs, rubs, gallops RESPIRATORY:  Clear to auscultation without rales, wheezing or rhonchi  ABDOMEN: Soft, non-tender, non-distended MUSCULOSKELETAL:  No edema; No deformity  SKIN: Warm and dry NEUROLOGIC:  Alert and oriented x 3 PSYCHIATRIC:  Normal affect   ASSESSMENT:    1. Chest pain of uncertain etiology   2. Coronary artery disease of native artery of native heart with stable angina pectoris ([place])   3. Essential hypertension   4. Pure hypercholesterolemia    PLAN:    In order of problems listed above:  1.  Chest pain -pain slightly improved on low dose nitrates at last OV and was increased to 30mg  daily but really has not changed her exertional angina -today she tells me that she gets chest tightness and SOB with any housework she does -I reviewed the findings of the coronary CTA and echo with her and her husband today which showed borderline obstructive disease of 50-69% in LAD, LCx and RCA -I have  recommended add Toprol XL 25mg  daily -I have also recommended that we proceed with cardiac cath to further define coronary anatomy and assess filling pressures -Cardiac catheterization was discussed with the patient fully. The patient understands that risks include but are not limited to stroke (1 in 1000), death (1 in 59), kidney failure [usually temporary] (1 in 500), bleeding (1 in 200), allergic reaction [possibly serious] (1 in 200).  The patient understands and is willing to proceed.     2.  ASCAD -Coronary CTA showing a coronary calcium score of 298 with  moderate atherosclerosis of the ostial RCA, proximal to mid LAD and proximal LCx with normal coronary origin with right dominance.  FFR analysis demonstrated possibly flow-limiting lesion in the diagonal branch and mid to distal LAD -continue ASA, statin  3.  HTN -BP controlled on exam today -continue HCTZ 12.5mg  daily. Imdur 30mg  daily, Telmisartan 40mg  daily -creatinie was 0.98 in Jan 2021 and K+ 3.8  4.  HLD -LDL goal < 70 -LDL was 81 in march -Atorvastatin was increased to 80mg  daily -FLp and ALT were repeated today and are pending   Medication Adjustments/Labs and Tests Ordered: Current medicines are reviewed at length with the patient today.  Concerns regarding medicines are outlined above.  No orders of the defined types were placed in this encounter.  No orders of the defined types were placed in this encounter.   Signed, Fransico Him, MD  08/12/2019 11:10 AM    [place]

## 2019-08-13 LAB — BASIC METABOLIC PANEL
BUN/Creatinine Ratio: 15 (ref 12–28)
BUN: 13 mg/dL (ref 8–27)
CO2: 24 mmol/L (ref 20–29)
Calcium: 9.4 mg/dL (ref 8.7–10.3)
Chloride: 105 mmol/L (ref 96–106)
Creatinine, Ser: 0.85 mg/dL (ref 0.57–1.00)
GFR calc Af Amer: 75 mL/min/{1.73_m2} (ref >59)
GFR calc non Af Amer: 65 mL/min/{1.73_m2} (ref >59)
Glucose: 97 mg/dL (ref 65–99)
Potassium: 4.4 mmol/L (ref 3.5–5.2)
Sodium: 144 mmol/L (ref 134–144)

## 2019-08-13 LAB — CBC
Hematocrit: 38.3 % (ref 34.0–46.6)
Hemoglobin: 12.6 g/dL (ref 11.1–15.9)
MCH: 29.7 pg (ref 26.6–33.0)
MCHC: 32.9 g/dL (ref 31.5–35.7)
MCV: 90 fL (ref 79–97)
Platelets: 283 10*3/uL (ref 150–450)
RBC: 4.24 x10E6/uL (ref 3.77–5.28)
RDW: 13.5 % (ref 11.7–15.4)
WBC: 8.3 10*3/uL (ref 3.4–10.8)

## 2019-08-20 ENCOUNTER — Other Ambulatory Visit (HOSPITAL_COMMUNITY)
Admission: RE | Admit: 2019-08-20 | Discharge: 2019-08-20 | Disposition: A | Discharge status: Home / Self Care | Payer: Medicare Other | Source: Ambulatory Visit | Attending: Interventional Cardiology | Admitting: Interventional Cardiology

## 2019-08-20 DIAGNOSIS — Z20822 Contact with and (suspected) exposure to covid-19: Secondary | ICD-10-CM | POA: Diagnosis not present

## 2019-08-20 DIAGNOSIS — Z01812 Encounter for preprocedural laboratory examination: Secondary | ICD-10-CM | POA: Insufficient documentation

## 2019-08-20 LAB — SARS CORONAVIRUS 2 (TAT 6-24 HRS): SARS Coronavirus 2: NEGATIVE

## 2019-08-21 ENCOUNTER — Telehealth: Payer: Self-pay | Admitting: Interventional Cardiology

## 2019-08-21 NOTE — H&P (Signed)
Diagonal disease.

## 2019-08-21 NOTE — Telephone Encounter (Signed)
Patient calling to confirm what medications to hold prior to cath tomorrow. Advised her not to take hydrochlorothiazide tomorrow morning. Patient verbalized understanding.

## 2019-08-21 NOTE — Telephone Encounter (Signed)
New Message  Patient is calling to go over the instructions for her heart cath. Please give patient a call back to discuss.

## 2019-08-22 ENCOUNTER — Encounter (HOSPITAL_COMMUNITY)
Admission: RE | Disposition: A | Discharge status: Home / Self Care | Payer: Self-pay | Source: Home / Self Care | Attending: Interventional Cardiology

## 2019-08-22 ENCOUNTER — Other Ambulatory Visit: Payer: Self-pay

## 2019-08-22 ENCOUNTER — Ambulatory Visit (HOSPITAL_COMMUNITY)
Admission: RE | Admit: 2019-08-22 | Discharge: 2019-08-22 | Disposition: A | Discharge status: Home / Self Care | Payer: Medicare Other | Attending: Interventional Cardiology | Admitting: Interventional Cardiology

## 2019-08-22 DIAGNOSIS — Z888 Allergy status to other drugs, medicaments and biological substances status: Secondary | ICD-10-CM | POA: Diagnosis not present

## 2019-08-22 DIAGNOSIS — N183 Chronic kidney disease, stage 3 unspecified: Secondary | ICD-10-CM | POA: Diagnosis not present

## 2019-08-22 DIAGNOSIS — Z79899 Other long term (current) drug therapy: Secondary | ICD-10-CM | POA: Insufficient documentation

## 2019-08-22 DIAGNOSIS — E785 Hyperlipidemia, unspecified: Secondary | ICD-10-CM | POA: Diagnosis not present

## 2019-08-22 DIAGNOSIS — Z7982 Long term (current) use of aspirin: Secondary | ICD-10-CM | POA: Insufficient documentation

## 2019-08-22 DIAGNOSIS — J45909 Unspecified asthma, uncomplicated: Secondary | ICD-10-CM | POA: Insufficient documentation

## 2019-08-22 DIAGNOSIS — R7303 Prediabetes: Secondary | ICD-10-CM | POA: Insufficient documentation

## 2019-08-22 DIAGNOSIS — I129 Hypertensive chronic kidney disease with stage 1 through stage 4 chronic kidney disease, or unspecified chronic kidney disease: Secondary | ICD-10-CM | POA: Insufficient documentation

## 2019-08-22 DIAGNOSIS — I712 Thoracic aortic aneurysm, without rupture: Secondary | ICD-10-CM | POA: Insufficient documentation

## 2019-08-22 DIAGNOSIS — Z87891 Personal history of nicotine dependence: Secondary | ICD-10-CM | POA: Diagnosis not present

## 2019-08-22 DIAGNOSIS — I25118 Atherosclerotic heart disease of native coronary artery with other forms of angina pectoris: Secondary | ICD-10-CM

## 2019-08-22 DIAGNOSIS — K219 Gastro-esophageal reflux disease without esophagitis: Secondary | ICD-10-CM | POA: Diagnosis not present

## 2019-08-22 DIAGNOSIS — E7800 Pure hypercholesterolemia, unspecified: Secondary | ICD-10-CM | POA: Insufficient documentation

## 2019-08-22 HISTORY — PX: LEFT HEART CATH AND CORONARY ANGIOGRAPHY: CATH118249

## 2019-08-22 HISTORY — PX: CORONARY PRESSURE/FFR STUDY: CATH118243

## 2019-08-22 LAB — POCT ACTIVATED CLOTTING TIME: Activated Clotting Time: 263 seconds

## 2019-08-22 SURGERY — LEFT HEART CATH AND CORONARY ANGIOGRAPHY
Anesthesia: LOCAL

## 2019-08-22 MED ORDER — FENTANYL CITRATE (PF) 100 MCG/2ML IJ SOLN
INTRAMUSCULAR | Status: DC | PRN
  Administered 2019-08-22: 25 ug via INTRAVENOUS

## 2019-08-22 MED ORDER — NITROGLYCERIN 1 MG/10 ML FOR IR/CATH LAB
INTRA_ARTERIAL | Status: DC | PRN
  Administered 2019-08-22: 200 ug via INTRACORONARY

## 2019-08-22 MED ORDER — IOHEXOL 350 MG/ML SOLN
INTRAVENOUS | Status: DC | PRN
  Administered 2019-08-22: 105 mL

## 2019-08-22 MED ORDER — LIDOCAINE HCL (PF) 1 % IJ SOLN
INTRAMUSCULAR | Status: AC
  Filled 2019-08-22: qty 30

## 2019-08-22 MED ORDER — HEPARIN SODIUM (PORCINE) 1000 UNIT/ML IJ SOLN
INTRAMUSCULAR | Status: AC
  Filled 2019-08-22: qty 1

## 2019-08-22 MED ORDER — MIDAZOLAM HCL 2 MG/2ML IJ SOLN
INTRAMUSCULAR | Status: AC
  Filled 2019-08-22: qty 2

## 2019-08-22 MED ORDER — SODIUM CHLORIDE 0.9 % WEIGHT BASED INFUSION: 1.0000 mL/kg/h | INTRAVENOUS | Status: DC

## 2019-08-22 MED ORDER — SODIUM CHLORIDE 0.9 % WEIGHT BASED INFUSION
3.0000 mL/kg/h | INTRAVENOUS | Status: AC
  Administered 2019-08-22: 3 mL/kg/h via INTRAVENOUS

## 2019-08-22 MED ORDER — MIDAZOLAM HCL 2 MG/2ML IJ SOLN
INTRAMUSCULAR | Status: DC | PRN
  Administered 2019-08-22 (×2): 1 mg via INTRAVENOUS

## 2019-08-22 MED ORDER — SODIUM CHLORIDE 0.9% FLUSH: 3.0000 mL | INTRAVENOUS | Status: DC | PRN

## 2019-08-22 MED ORDER — SODIUM CHLORIDE 0.9 % IV SOLN: 250.0000 mL | INTRAVENOUS | Status: DC | PRN

## 2019-08-22 MED ORDER — VERAPAMIL HCL 2.5 MG/ML IV SOLN
INTRAVENOUS | Status: DC | PRN
  Administered 2019-08-22: 11:00:00 10 mL via INTRA_ARTERIAL

## 2019-08-22 MED ORDER — SODIUM CHLORIDE 0.9% FLUSH: 3.0000 mL | Freq: Two times a day (BID) | INTRAVENOUS | Status: DC

## 2019-08-22 MED ORDER — HEPARIN (PORCINE) IN NACL 1000-0.9 UT/500ML-% IV SOLN
INTRAVENOUS | Status: DC | PRN
  Administered 2019-08-22 (×2): 500 mL

## 2019-08-22 MED ORDER — ONDANSETRON HCL 4 MG/2ML IJ SOLN: 4.0000 mg | Freq: Four times a day (QID) | INTRAMUSCULAR | Status: DC | PRN

## 2019-08-22 MED ORDER — VERAPAMIL HCL 2.5 MG/ML IV SOLN
INTRAVENOUS | Status: AC
  Filled 2019-08-22: qty 2

## 2019-08-22 MED ORDER — HEPARIN (PORCINE) IN NACL 1000-0.9 UT/500ML-% IV SOLN
INTRAVENOUS | Status: AC
  Filled 2019-08-22: qty 1000

## 2019-08-22 MED ORDER — LIDOCAINE HCL (PF) 1 % IJ SOLN
INTRAMUSCULAR | Status: DC | PRN
  Administered 2019-08-22: 2 mL

## 2019-08-22 MED ORDER — ASPIRIN 81 MG PO CHEW: 81.0000 mg | CHEWABLE_TABLET | Freq: Every day | ORAL | Status: DC

## 2019-08-22 MED ORDER — NITROGLYCERIN 1 MG/10 ML FOR IR/CATH LAB
INTRA_ARTERIAL | Status: AC
  Filled 2019-08-22: qty 10

## 2019-08-22 MED ORDER — ASPIRIN 81 MG PO CHEW: 81.0000 mg | CHEWABLE_TABLET | ORAL | Status: DC

## 2019-08-22 MED ORDER — SODIUM CHLORIDE 0.9 % IV SOLN: INTRAVENOUS | Status: DC

## 2019-08-22 MED ORDER — HYDRALAZINE HCL 20 MG/ML IJ SOLN: 10.0000 mg | INTRAMUSCULAR | Status: DC | PRN

## 2019-08-22 MED ORDER — ACETAMINOPHEN 325 MG PO TABS: 650.0000 mg | ORAL_TABLET | ORAL | Status: DC | PRN

## 2019-08-22 MED ORDER — LABETALOL HCL 5 MG/ML IV SOLN: 10.0000 mg | INTRAVENOUS | Status: DC | PRN

## 2019-08-22 MED ORDER — FENTANYL CITRATE (PF) 100 MCG/2ML IJ SOLN
INTRAMUSCULAR | Status: AC
  Filled 2019-08-22: qty 2

## 2019-08-22 MED ORDER — HEPARIN SODIUM (PORCINE) 1000 UNIT/ML IJ SOLN
INTRAMUSCULAR | Status: DC | PRN
  Administered 2019-08-22: 4000 [IU] via INTRAVENOUS
  Administered 2019-08-22: 2000 [IU] via INTRAVENOUS
  Administered 2019-08-22: 7000 [IU] via INTRAVENOUS

## 2019-08-22 SURGICAL SUPPLY — 14 items
CATH 5FR JL3.5 JR4 ANG PIG MP (CATHETERS) ×1 IMPLANT
CATH INFINITI 5FR JL4 (CATHETERS) ×1 IMPLANT
CATH VISTA GUIDE 6FR XBLAD3.5 (CATHETERS) ×1 IMPLANT
DEVICE RAD COMP TR BAND LRG (VASCULAR PRODUCTS) ×1 IMPLANT
GLIDESHEATH SLEND A-KIT 6F 22G (SHEATH) ×1 IMPLANT
GUIDEWIRE INQWIRE 1.5J.035X260 (WIRE) IMPLANT
GUIDEWIRE PRESSURE X 175 (WIRE) ×1 IMPLANT
INQWIRE 1.5J .035X260CM (WIRE) ×2
KIT ESSENTIALS PG (KITS) ×1 IMPLANT
KIT HEART LEFT (KITS) ×2 IMPLANT
PACK CARDIAC CATHETERIZATION (CUSTOM PROCEDURE TRAY) ×2 IMPLANT
SHEATH PROBE COVER 6X72 (BAG) ×1 IMPLANT
TRANSDUCER W/STOPCOCK (MISCELLANEOUS) ×2 IMPLANT
TUBING CIL FLEX 10 FLL-RA (TUBING) ×2 IMPLANT

## 2019-08-22 NOTE — Interval H&P Note (Signed)
Cath Lab Visit (complete for each Cath Lab visit)  Clinical Evaluation Leading to the Procedure:   ACS: No.  Non-ACS:    Anginal Classification: CCS Weber  Anti-ischemic medical therapy: Minimal Therapy (1 class of medications)  Non-Invasive Test Results: High-risk stress test findings: cardiac mortality >3%/year  Prior CABG: No previous CABG      History and Physical Interval Note:  08/22/2019 10:47 AM  Connie Weber  has presented today for surgery, with the diagnosis of Coronary Artery Disease.  The various methods of treatment have been discussed with the patient and family. After consideration of risks, benefits and other options for treatment, the patient has consented to  Procedure(s): LEFT HEART CATH AND CORONARY ANGIOGRAPHY (N/A) as a surgical intervention.  The patient's history has been reviewed, patient examined, no change in status, stable for surgery.  I have reviewed the patient's chart and labs.  Questions were answered to the patient's satisfaction.     Connie Weber

## 2019-08-22 NOTE — CV Procedure (Signed)
   Left heart cath, coronary angiography, and hemodynamic recordings via right radial using real-time vascular ultrasound for arterial access  Significant pain in the forearm and brachial related to catheter size relative to arteries.  Left main is widely patent.  Segmental 50% proximal to mid LAD narrowing.  The first diagonal arises in the middle of the segmental LAD stenosis and contains 75 to 80% stenosis ostially and into the proximal segment.  Circumflex is large with no significant obstructive lesion.  Right coronary is dominant with no significant obstructive lesion.  RFR: LAD 0.93.  Significant is less than 0.89.  Did not assess the diagonal as it is likely hemodynamically significant.  Recommend aggressive medical therapy for diagonal disease.  I shot away from PCI all the diagonal because PCI would involve the LAD and possibility lead stenting.   Recommendation: Intensify therapy for diastolic heart failure and angina.

## 2019-08-22 NOTE — Progress Notes (Signed)
Patient and husband was given discharge instructions. Both verbalized understanding. 

## 2019-08-22 NOTE — Discharge Instructions (Signed)
Drink plenty of fluid for 48 hours and keep wrist elevated at heart level for 24 hours  Radial Site Care   This sheet gives you information about how to care for yourself after your procedure. Your health care provider may also give you more specific instructions. If you have problems or questions, contact your health care provider. What can I expect after the procedure? After the procedure, it is common to have:  Bruising and tenderness at the catheter insertion area. Follow these instructions at home: Medicines  Take over-the-counter and prescription medicines only as told by your health care provider. Insertion site care 1. Follow instructions from your health care provider about how to take care of your insertion site. Make sure you: ? Wash your hands with soap and water before you change your bandage (dressing). If soap and water are not available, use hand sanitizer. ? remove your dressing as told by your health care provider. In 24 hours 2. Check your insertion site every day for signs of infection. Check for: ? Redness, swelling, or pain. ? Fluid or blood. ? Pus or a bad smell. ? Warmth. 3. Do not take baths, swim, or use a hot tub until your health care provider approves. 4. You may shower 24-48 hours after the procedure, or as directed by your health care provider. ? Remove the dressing and gently wash the site with plain soap and water. ? Pat the area dry with a clean towel. ? Do not rub the site. That could cause bleeding. 5. Do not apply powder or lotion to the site. Activity   1. For 24 hours after the procedure, or as directed by your health care provider: ? Do not flex or bend the affected arm. ? Do not push or pull heavy objects with the affected arm. ? Do not drive yourself home from the hospital or clinic. You may drive 24 hours after the procedure unless your health care provider tells you not to. ? Do not operate machinery or power tools. 2. Do not lift  anything that is heavier than 10 lb (4.5 kg), or the limit that you are told, until your health care provider says that it is safe. For 4 days 3. Ask your health care provider when it is okay to: ? Return to work or school. ? Resume usual physical activities or sports. ? Resume sexual activity. General instructions  If the catheter site starts to bleed, raise your arm and put firm pressure on the site. If the bleeding does not stop, get help right away. This is a medical emergency.  If you went home on the same day as your procedure, a responsible adult should be with you for the first 24 hours after you arrive home.  Keep all follow-up visits as told by your health care provider. This is important. Contact a health care provider if:  You have a fever.  You have redness, swelling, or yellow drainage around your insertion site. Get help right away if:  You have unusual pain at the radial site.  The catheter insertion area swells very fast.  The insertion area is bleeding, and the bleeding does not stop when you hold steady pressure on the area.  Your arm or hand becomes pale, cool, tingly, or numb. These symptoms may represent a serious problem that is an emergency. Do not wait to see if the symptoms will go away. Get medical help right away. Call your local emergency services (911 in the [place] Do not   drive yourself to the hospital. Summary  After the procedure, it is common to have bruising and tenderness at the site.  Follow instructions from your health care provider about how to take care of your radial site wound. Check the wound every day for signs of infection.  Do not lift anything that is heavier than 10 lb (4.5 kg), or the limit that you are told, until your health care provider says that it is safe. This information is not intended to replace advice given to you by your health care provider. Make sure you discuss any questions you have with your health care  provider. Document Revised: 05/16/2017 Document Reviewed: 05/16/2017 Elsevier Patient Education  2020 [employer]  

## 2019-09-01 DIAGNOSIS — I10 Essential (primary) hypertension: Secondary | ICD-10-CM | POA: Diagnosis not present

## 2019-09-01 DIAGNOSIS — E7800 Pure hypercholesterolemia, unspecified: Secondary | ICD-10-CM | POA: Diagnosis not present

## 2019-09-01 DIAGNOSIS — J45909 Unspecified asthma, uncomplicated: Secondary | ICD-10-CM | POA: Diagnosis not present

## 2019-09-01 DIAGNOSIS — I209 Angina pectoris, unspecified: Secondary | ICD-10-CM | POA: Diagnosis not present

## 2019-09-01 DIAGNOSIS — G2581 Restless legs syndrome: Secondary | ICD-10-CM | POA: Diagnosis not present

## 2019-09-01 DIAGNOSIS — M899 Disorder of bone, unspecified: Secondary | ICD-10-CM | POA: Diagnosis not present

## 2019-09-01 DIAGNOSIS — I5030 Unspecified diastolic (congestive) heart failure: Secondary | ICD-10-CM | POA: Diagnosis not present

## 2019-09-01 DIAGNOSIS — K219 Gastro-esophageal reflux disease without esophagitis: Secondary | ICD-10-CM | POA: Diagnosis not present

## 2019-09-01 DIAGNOSIS — I2510 Atherosclerotic heart disease of native coronary artery without angina pectoris: Secondary | ICD-10-CM | POA: Diagnosis not present

## 2019-09-09 NOTE — Progress Notes (Signed)
Cardiology Office Note    Date:  09/16/2019   ID:  MADHUMITHA SUD, DOB [birth year], MRN [id number]  PCP:  Gaynelle Arabian, MD  Cardiologist: Fransico Him, MD EPS: None  No chief complaint on file.   History of Present Illness:  Connie Weber is a 79 y.o. female with a hx of HTN, HLD, asthma, GERD and CKD stage III, ascending aortic aneurysm measuring 40 mm and CAD with Coronary CTA showing a coronary calcium score of 298 with  moderate atherosclerosis of the ostial RCA, proximal to mid LAD and proximal LCx with normal coronary origin with right dominance.  FFR analysis demonstrated possibly flow-limiting lesion in the diagonal branch and mid to distal LAD with recommendations for aggressive medical management and if continued chest discomfort then consider cardiac catheterization.    Patient continued to have chest pain dyspnea on exertion despite increase in long-acting nitrates.     Patient underwent left heart cath 08/22/2019 75-80 ostial first diagonal, 50% proximal to mid LAD RF are of the LAD 0.93 recommend medical therapy for diagonal disease because PCI of the diagonal would involve the LAD and possible lead to stenting.  Recommend intensify treatment for diastolic heart failure and angina.  Patient comes in for f/u. Still having chest pain and dyspnea on exertion with little activity-can sweep half the floor and has to stop and sit down. Hasn't used NTG. Very limited in what she can do in the past 6 months. No energy.Imdur  Has helped some. Explained cath in detail to patient who said she didn't understand.  Past Medical History:  Diagnosis Date  . Arthritis   . Ascending aorta dilatation (HCC)    34mm by echo 2020  . Asthma   . Cancer ([facility])    basal cell   . CKD (chronic kidney disease) stage 3, GFR 30-59 ml/min   . Complication of anesthesia    difficulty waking up, feels that it was due to not having an inhaler used prior to surgery  . GERD (gastroesophageal reflux disease)    . GERD without esophagitis   . H/O hiatal hernia   . Headache(784.0)    migraines as a young adult  . History of basal cell carcinoma of skin   . Hypercholesteremia   . Hypertension   . Osteopenia   . Prediabetes     Past Surgical History:  Procedure Laterality Date  . ABDOMINAL HYSTERECTOMY    . APPENDECTOMY    . CHOLECYSTECTOMY    . COLONOSCOPY    . EYE SURGERY     cataract surgery x 2 and implants  . INTRAVASCULAR PRESSURE WIRE/FFR STUDY N/A 08/22/2019   Procedure: INTRAVASCULAR PRESSURE WIRE/FFR STUDY;  Surgeon: Belva Crome, MD;  Location: [facility];  Service: Cardiovascular;  Laterality: N/A;  . LEFT HEART CATH AND CORONARY ANGIOGRAPHY N/A 08/22/2019   Procedure: LEFT HEART CATH AND CORONARY ANGIOGRAPHY;  Surgeon: Belva Crome, MD;  Location: [facility];  Service: Cardiovascular;  Laterality: N/A;  . ORIF ANKLE FRACTURE Right 12/25/2012   Procedure: OPEN REDUCTION INTERNAL FIXATION (ORIF) RIGHT ANKLE FRACTURE;  Surgeon: Marianna Payment, MD;  Location: [facility];  Service: Orthopedics;  Laterality: Right;  . rotator cuff surgery Left 2013    Current Medications: Current Meds  Medication Sig  . albuterol (VENTOLIN HFA) 108 (90 Base) MCG/ACT inhaler Inhale 1-2 puffs into the lungs every 4 (four) hours as needed for wheezing or shortness of breath.  Marland Kitchen aspirin 81  MG EC tablet Take 1 tablet (81 mg total) by mouth daily. Swallow whole.  Marland Kitchen atorvastatin (LIPITOR) 80 MG tablet Take 1 tablet (80 mg total) by mouth daily. (Patient taking differently: Take 80 mg by mouth every evening. )  . BIOTIN 5000 PO Take 5,000 mcg by mouth in the morning and at bedtime.   . Calcium Carbonate-Vitamin D (CALTRATE 600+D) 600-400 MG-UNIT per tablet Take 1 tablet by mouth 2 (two) times daily.  . Glucosamine-Chondroitin (GLUCOSAMINE CHONDR COMPLEX PO) Take 1 tablet by mouth in the morning and at bedtime.   Marland Kitchen ibuprofen (ADVIL,MOTRIN) 200 MG tablet Take 200-400 mg by mouth every 8 (eight)  hours as needed (for pain.).   Marland Kitchen isosorbide mononitrate (IMDUR) 60 MG 24 hr tablet Take 1 tablet (60 mg total) by mouth daily.  . metoprolol tartrate (LOPRESSOR) 25 MG tablet Take 1 tablet (25 mg total) by mouth 2 (two) times daily.  . Multiple Vitamin (MULTIVITAMIN WITH MINERALS) TABS tablet Take 1 tablet by mouth daily.  . niacin 500 MG tablet Take 500 mg by mouth at bedtime.  Marland Kitchen omeprazole (PRILOSEC) 40 MG capsule Take 40 mg by mouth daily.  [name] Glycol-Propyl Glycol (SYSTANE) 0.4-0.3 % SOLN Place 1 drop into both eyes 3 (three) times daily as needed (dry/irritated eyes.).  Marland Kitchen pseudoephedrine (PSUDATABS) 30 MG tablet Take 30 mg by mouth every 4 (four) hours as needed for congestion (congestion/allergies.).   Marland Kitchen telmisartan (MICARDIS) 80 MG tablet Take 40 mg by mouth daily.   . vitamin C (ASCORBIC ACID) 500 MG tablet Take 500 mg by mouth 2 (two) times daily.  . Wheat Dextrin (EQ FIBER POWDER PO) Take 15.12 g by mouth daily. 2 heaping teaspoons  . zinc gluconate 50 MG tablet Take 1 tablet (50 mg total) by mouth daily.  . [DISCONTINUED] hydrochlorothiazide (MICROZIDE) 12.5 MG capsule Take 12.5 mg by mouth daily.  . [DISCONTINUED] isosorbide mononitrate (IMDUR) 30 MG 24 hr tablet Take 1 tablet (30 mg total) by mouth daily.     Allergies:   Ace inhibitors, Actonel [risedronate sodium], Atorvastatin, Fosamax [alendronate sodium], Latex, and Prednisone   Social History   Socioeconomic History  . Marital status: Married    Spouse name: Not on file  . Number of children: Not on file  . Years of education: Not on file  . Highest education level: Not on file  Occupational History  . Not on file  Tobacco Use  . Smoking status: Former Research scientist (life sciences)  . Smokeless tobacco: Never Used  Substance and Sexual Activity  . Alcohol use: No  . Drug use: No  . Sexual activity: Not on file  Other Topics Concern  . Not on file  Social History Narrative  . Not on file   Social Determinants of Health    Financial Resource Strain:   . Difficulty of Paying Living Expenses:   Food Insecurity:   . Worried About Charity fundraiser in the Last Year:   . Arboriculturist in the Last Year:   Transportation Needs:   . Film/video editor (Medical):   Marland Kitchen Lack of Transportation (Non-Medical):   Physical Activity:   . Days of Exercise per Week:   . Minutes of Exercise per Session:   Stress:   . Feeling of Stress :   Social Connections:   . Frequency of Communication with Friends and Family:   . Frequency of Social Gatherings with Friends and Family:   . Attends Religious Services:   .  Active Member of Clubs or Organizations:   . Attends Archivist Meetings:   Marland Kitchen Marital Status:      Family History:  The patient's   family history includes Breast cancer in her sister, sister, and sister.   ROS:   Please see the history of present illness.    ROS All other systems reviewed and are negative.   PHYSICAL EXAM:   VS:  BP 124/68   Pulse 65   Ht 5\' 2"  (1.575 m)   Wt 185 lb 12.8 oz (84.3 kg)   SpO2 96%   BMI 33.98 kg/m   Physical Exam  GEN: Well nourished, well developed, in no acute distress  Neck: no JVD, carotid bruits, or masses Cardiac:RRR; no murmurs, rubs, or gallops  Respiratory:  clear to auscultation bilaterally, normal work of breathing GI: soft, nontender, nondistended, + BS Ext: Right arm at cath site without hematoma or hemorrhage, lower extremities without cyanosis, clubbing, or edema, Good distal pulses bilaterally Neuro:  Alert and Oriented x 3,Psych: euthymic mood, full affect  Wt Readings from Last 3 Encounters:  09/16/19 185 lb 12.8 oz (84.3 kg)  08/22/19 185 lb (83.9 kg)  08/12/19 187 lb 12.8 oz (85.2 kg)      Studies/Labs Reviewed:   EKG:  EKG is not ordered today.  Recent Labs: 04/22/2019: NT-Pro BNP 96 08/12/2019: ALT 20; BUN 13; Creatinine, Ser 0.85; Hemoglobin 12.6; Platelets 283; Potassium 4.4; Sodium 144   Lipid Panel    Component Value  Date/Time   CHOL 141 08/12/2019 1039   TRIG 113 08/12/2019 1039   HDL 49 08/12/2019 1039   CHOLHDL 2.9 08/12/2019 1039   LDLCALC 72 08/12/2019 1039    Additional studies/ records that were reviewed today include:  Cardiac catheterization 4/30/2021Left heart cath, coronary angiography, and hemodynamic recordings via right radial using real-time vascular ultrasound for arterial access Significant pain in the forearm and brachial related to catheter size relative to arteries. Left main is widely patent. Segmental 50% proximal to mid LAD narrowing.  The first diagonal arises in the middle of the segmental LAD stenosis and contains 75 to 80% stenosis ostially and into the proximal segment. Circumflex is large with no significant obstructive lesion. Right coronary is dominant with no significant obstructive lesion. RFR: LAD 0.93.  Significant is less than 0.89.  Did not assess the diagonal as it is likely hemodynamically significant.  Recommend aggressive medical therapy for diagonal disease.  I shot away from PCI all the diagonal because PCI would involve the LAD and possibility lead stenting.  Recommendation: Intensify therapy for diastolic heart failure and angina.     ASSESSMENT:    1. Coronary artery disease involving native coronary artery of native heart without angina pectoris   2. Essential hypertension   3. Hyperlipidemia, unspecified hyperlipidemia type      PLAN:  In order of problems listed above:  CAD with coronary CTA calcium score 298 with moderate atherosclerosis of the ostial RCA proximal to mid LAD and proximal circumflex, FFR analysis demonstrated possible flow-limiting lesion in the diagonal branch in the mid to distal LAD.  Cardiac cath 08/22/19-50% proximal to mid LAD and 75% to 80% diagonal 1 medical therapy recommended. Still having regular angina. LVEDP was 23-25 so will increase HCTZ 25 mg daily and increase Imdur 60 mg daily. F/u in 3-4 weeks with bmet and appt.  Consider Ranexa if this doesn't help. Patient reluctant to take extra meds.  Essential hypertension controlled  Hyperlipidemia on atorvastatin 80  mg daily LDL 72 08/12/2019    Medication Adjustments/Labs and Tests Ordered: Current medicines are reviewed at length with the patient today.  Concerns regarding medicines are outlined above.  Medication changes, Labs and Tests ordered today are listed in the Patient Instructions below. Patient Instructions  Medication Instructions:  Your physician has recommended you make the following change in your medication:   1. INCREASE: hydrochlorothiazide to 25 mg once a day  2. INCREASE: isosorbide mononitrate (imdur) to 60 mg once a day  *If you need a refill on your cardiac medications before your next appointment, please call your pharmacy*   Lab Work: None ordered  If you have labs (blood work) drawn today and your tests are completely normal, you will receive your results only by: Marland Kitchen MyChart Message (if you have MyChart) OR . A paper copy in the mail If you have any lab test that is abnormal or we need to change your treatment, we will call you to review the results.   Testing/Procedures: None ordered   Follow-Up: Follow up with Ermalinda Barrios, PA on 10/08/19 at 11:45 AM  Other Instructions None     Signed, Ermalinda Barrios, PA-C  09/16/2019 11:05 AM    [address], [place], [place]  [address] Phone: [phone]; Fax: [phone]

## 2019-09-15 ENCOUNTER — Telehealth: Payer: Self-pay | Admitting: Cardiology

## 2019-09-15 NOTE — Telephone Encounter (Signed)
Patient's wife wants to know if it is okay for her husband to come with her to her appt on 09/16/19 that way he does not have to sit out in the car and wait on her. She states that he is a patient of Dr. Radford Pax as well and has some heart related issues as well as emphysema. Please advise.

## 2019-09-15 NOTE — Telephone Encounter (Signed)
Called and spoke to the patient. Made her aware that the patient's husband can come with her to the appointment to help her get around and help remember things. She verbalized understanding and thanked me for the call.

## 2019-09-16 ENCOUNTER — Encounter: Payer: Self-pay | Admitting: Physician Assistant

## 2019-09-16 ENCOUNTER — Ambulatory Visit (INDEPENDENT_AMBULATORY_CARE_PROVIDER_SITE_OTHER): Payer: Medicare Other | Admitting: Physician Assistant

## 2019-09-16 ENCOUNTER — Other Ambulatory Visit: Payer: Self-pay

## 2019-09-16 VITALS — BP 124/68 | HR 65 | Ht 62.0 in | Wt 185.8 lb

## 2019-09-16 DIAGNOSIS — E785 Hyperlipidemia, unspecified: Secondary | ICD-10-CM | POA: Diagnosis not present

## 2019-09-16 DIAGNOSIS — I10 Essential (primary) hypertension: Secondary | ICD-10-CM | POA: Diagnosis not present

## 2019-09-16 DIAGNOSIS — I2510 Atherosclerotic heart disease of native coronary artery without angina pectoris: Secondary | ICD-10-CM

## 2019-09-16 MED ORDER — ISOSORBIDE MONONITRATE ER 60 MG PO TB24: 60.0000 mg | ORAL_TABLET | Freq: Every day | ORAL | 3 refills | Status: DC

## 2019-09-16 MED ORDER — HYDROCHLOROTHIAZIDE 25 MG PO TABS
25.0000 mg | ORAL_TABLET | Freq: Every day | ORAL | 3 refills | Status: DC
Start: 2019-09-16 — End: 2019-11-11

## 2019-09-16 NOTE — Patient Instructions (Signed)
Medication Instructions:  Your physician has recommended you make the following change in your medication:   1. INCREASE: hydrochlorothiazide to 25 mg once a day  2. INCREASE: isosorbide mononitrate (imdur) to 60 mg once a day  *If you need a refill on your cardiac medications before your next appointment, please call your pharmacy*   Lab Work: None ordered  If you have labs (blood work) drawn today and your tests are completely normal, you will receive your results only by: Marland Kitchen MyChart Message (if you have MyChart) OR . A paper copy in the mail If you have any lab test that is abnormal or we need to change your treatment, we will call you to review the results.   Testing/Procedures: None ordered   Follow-Up: Follow up with Ermalinda Barrios, PA on 10/08/19 at 11:45 AM  Other Instructions None

## 2019-10-02 DIAGNOSIS — Z961 Presence of intraocular lens: Secondary | ICD-10-CM | POA: Diagnosis not present

## 2019-10-06 ENCOUNTER — Telehealth: Payer: Self-pay | Admitting: Physician Assistant

## 2019-10-06 NOTE — Telephone Encounter (Signed)
Patient is requesting for husband to be with her for her appt to help remember things. Made patient aware that he can come.

## 2019-10-06 NOTE — Telephone Encounter (Signed)
° ° °  Pt calling and would like to request if her husband can come in to her appt. On 10/08/19. She said her husband is driving her and he cant stay in car it is too hot and she needs him to help her listening to the doctor  Please advise

## 2019-10-07 NOTE — Progress Notes (Signed)
Cardiology Office Note    Date:  10/08/2019   ID:  Connie Weber, DOB 1940-07-14, MRN [id number]  PCP:  Gaynelle Arabian, MD  Cardiologist: Sinclair Grooms, MD EPS: None  Chief Complaint  Patient presents with  . Follow-up    History of Present Illness:  Connie Weber is a 79 y.o. female  with a hx of HTN, HLD, asthma, GERD and CKD stage III, ascending aortic aneurysm measuring 40 mm and CAD with Coronary CTA showing a coronary calcium score of 298 with  moderate atherosclerosis of the ostial RCA, proximal to mid LAD and proximal LCx with normal coronary origin with right dominance.  FFR analysis demonstrated possibly flow-limiting lesion in the diagonal branch and mid to distal LAD with recommendations for aggressive medical management and if continued chest discomfort then consider cardiac catheterization.    Patient continued to have chest pain dyspnea on exertion despite increase in long-acting nitrates.    Patient underwent left heart cath 08/22/2019 75-80 ostial first diagonal, 50% proximal to mid LAD RFof the LAD 0.93 recommend medical therapy for diagonal disease because PCI of the diagonal would involve the LAD and possible lead to stenting.  Recommend intensify treatment for diastolic heart failure and angina.   I saw the patient 09/16/2019 when she was having chest pain and dyspnea on exertion with little activity.  I increase HCTZ to 25 mg daily because of the elevated LVEDP and increase Imdur to 60 mg daily.  Also consider Ranexa but patient was reluctant to take any extra meds.  Patient comes in for f/u with her husband. Chest pain and dyspnea on exertion have improved with above meds but she still has some-just doesn't last as long and not as frequent. Eating fast food daily.    Past Medical History:  Diagnosis Date  . Arthritis   . Ascending aorta dilatation (HCC)    66mm by echo 2020  . Asthma   . Cancer ([place])    basal cell   . CKD (chronic kidney disease) stage 3, GFR  30-59 ml/min   . Complication of anesthesia    difficulty waking up, feels that it was due to not having an inhaler used prior to surgery  . GERD (gastroesophageal reflux disease)   . GERD without esophagitis   . H/O hiatal hernia   . Headache(784.0)    migraines as a young adult  . History of basal cell carcinoma of skin   . Hypercholesteremia   . Hypertension   . Osteopenia   . Prediabetes     Past Surgical History:  Procedure Laterality Date  . ABDOMINAL HYSTERECTOMY    . APPENDECTOMY    . CHOLECYSTECTOMY    . COLONOSCOPY    . EYE SURGERY     cataract surgery x 2 and implants  . INTRAVASCULAR PRESSURE WIRE/FFR STUDY N/A 08/22/2019   Procedure: INTRAVASCULAR PRESSURE WIRE/FFR STUDY;  Surgeon: Belva Crome, MD;  Location: [facility];  Service: Cardiovascular;  Laterality: N/A;  . LEFT HEART CATH AND CORONARY ANGIOGRAPHY N/A 08/22/2019   Procedure: LEFT HEART CATH AND CORONARY ANGIOGRAPHY;  Surgeon: Belva Crome, MD;  Location: [facility];  Service: Cardiovascular;  Laterality: N/A;  . ORIF ANKLE FRACTURE Right 12/25/2012   Procedure: OPEN REDUCTION INTERNAL FIXATION (ORIF) RIGHT ANKLE FRACTURE;  Surgeon: Marianna Payment, MD;  Location: [facility];  Service: Orthopedics;  Laterality: Right;  . rotator cuff surgery Left 2013    Current Medications:  Current Meds  Medication Sig  . albuterol (VENTOLIN HFA) 108 (90 Base) MCG/ACT inhaler Inhale 1-2 puffs into the lungs every 4 (four) hours as needed for wheezing or shortness of breath.  Marland Kitchen aspirin 81 MG EC tablet Take 1 tablet (81 mg total) by mouth daily. Swallow whole.  Marland Kitchen atorvastatin (LIPITOR) 80 MG tablet Take 1 tablet (80 mg total) by mouth daily.  Marland Kitchen BIOTIN 5000 PO Take 5,000 mcg by mouth in the morning and at bedtime.   . Calcium Carbonate-Vitamin D (CALTRATE 600+D) 600-400 MG-UNIT per tablet Take 1 tablet by mouth 2 (two) times daily.  . Glucosamine-Chondroitin (GLUCOSAMINE CHONDR COMPLEX PO) Take 1 tablet by  mouth in the morning and at bedtime.   . hydrochlorothiazide (HYDRODIURIL) 25 MG tablet Take 1 tablet (25 mg total) by mouth daily.  Marland Kitchen ibuprofen (ADVIL,MOTRIN) 200 MG tablet Take 200-400 mg by mouth every 8 (eight) hours as needed (for pain.).   Marland Kitchen isosorbide mononitrate (IMDUR) 60 MG 24 hr tablet Take 1 tablet (60 mg total) by mouth daily.  . metoprolol tartrate (LOPRESSOR) 25 MG tablet Take 1 tablet (25 mg total) by mouth 2 (two) times daily.  . Multiple Vitamin (MULTIVITAMIN WITH MINERALS) TABS tablet Take 1 tablet by mouth daily.  . niacin 500 MG tablet Take 500 mg by mouth at bedtime.  Marland Kitchen omeprazole (PRILOSEC) 40 MG capsule Take 40 mg by mouth daily.  [name] Glycol-Propyl Glycol (SYSTANE) 0.4-0.3 % SOLN Place 1 drop into both eyes 3 (three) times daily as needed (dry/irritated eyes.).  Marland Kitchen pseudoephedrine (PSUDATABS) 30 MG tablet Take 30 mg by mouth every 4 (four) hours as needed for congestion (congestion/allergies.).   Marland Kitchen telmisartan (MICARDIS) 20 MG tablet Take 1 tablet (20 mg total) by mouth daily.  . vitamin C (ASCORBIC ACID) 500 MG tablet Take 500 mg by mouth 2 (two) times daily.  . Wheat Dextrin (EQ FIBER POWDER PO) Take 15.12 g by mouth daily. 2 heaping teaspoons  . zinc gluconate 50 MG tablet Take 1 tablet (50 mg total) by mouth daily.  . [DISCONTINUED] telmisartan (MICARDIS) 80 MG tablet Take 40 mg by mouth daily.      Allergies:   Ace inhibitors, Actonel [risedronate sodium], Atorvastatin, Fosamax [alendronate sodium], Latex, and Prednisone   Social History   Socioeconomic History  . Marital status: Married    Spouse name: Not on file  . Number of children: Not on file  . Years of education: Not on file  . Highest education level: Not on file  Occupational History  . Not on file  Tobacco Use  . Smoking status: Former Research scientist (life sciences)  . Smokeless tobacco: Never Used  Substance and Sexual Activity  . Alcohol use: No  . Drug use: No  . Sexual activity: Not on file  Other  Topics Concern  . Not on file  Social History Narrative  . Not on file   Social Determinants of Health   Financial Resource Strain:   . Difficulty of Paying Living Expenses:   Food Insecurity:   . Worried About Charity fundraiser in the Last Year:   . Arboriculturist in the Last Year:   Transportation Needs:   . Film/video editor (Medical):   Marland Kitchen Lack of Transportation (Non-Medical):   Physical Activity:   . Days of Exercise per Week:   . Minutes of Exercise per Session:   Stress:   . Feeling of Stress :   Social Connections:   . Frequency of  Communication with Friends and Family:   . Frequency of Social Gatherings with Friends and Family:   . Attends Religious Services:   . Active Member of Clubs or Organizations:   . Attends Archivist Meetings:   Marland Kitchen Marital Status:      Family History:  The patient's family history includes Breast cancer in her sister, sister, and sister.   ROS:   Please see the history of present illness.    ROS All other systems reviewed and are negative.   PHYSICAL EXAM:   VS:  BP 126/80   Pulse 78   Ht 5\' 2"  (1.575 m)   Wt 184 lb 1.9 oz (83.5 kg)   SpO2 95%   BMI 33.68 kg/m   Physical Exam  GEN: Obese, in no acute distress  Neck: no JVD, carotid bruits, or masses Cardiac:RRR; no murmurs, rubs, or gallops  Respiratory:  clear to auscultation bilaterally, normal work of breathing GI: soft, nontender, nondistended, + BS Ext: trace of edema without cyanosis, clubbing,  Good distal pulses bilaterally Neuro:  Alert and Oriented x 3 Psych: euthymic mood, full affect  Wt Readings from Last 3 Encounters:  10/08/19 184 lb 1.9 oz (83.5 kg)  09/16/19 185 lb 12.8 oz (84.3 kg)  08/22/19 185 lb (83.9 kg)      Studies/Labs Reviewed:   EKG:  EKG is not ordered today.    Recent Labs: 04/22/2019: NT-Pro BNP 96 08/12/2019: ALT 20; BUN 13; Creatinine, Ser 0.85; Hemoglobin 12.6; Platelets 283; Potassium 4.4; Sodium 144   Lipid Panel     Component Value Date/Time   CHOL 141 08/12/2019 1039   TRIG 113 08/12/2019 1039   HDL 49 08/12/2019 1039   CHOLHDL 2.9 08/12/2019 1039   LDLCALC 72 08/12/2019 1039    Additional studies/ records that were reviewed today include:  Cardiac catheterization 4/30/2021Left heart cath, coronary angiography, and hemodynamic recordings via right radial using real-time vascular ultrasound for arterial access Significant pain in the forearm and brachial related to catheter size relative to arteries. Left main is widely patent. Segmental 50% proximal to mid LAD narrowing.  The first diagonal arises in the middle of the segmental LAD stenosis and contains 75 to 80% stenosis ostially and into the proximal segment. Circumflex is large with no significant obstructive lesion. Right coronary is dominant with no significant obstructive lesion. RFR: LAD 0.93.  Significant is less than 0.89.  Did not assess the diagonal as it is likely hemodynamically significant.  Recommend aggressive medical therapy for diagonal disease.  I shot away from PCI all the diagonal because PCI would involve the LAD and possibility lead stenting.  Recommendation: Intensify therapy for diastolic heart failure and angina.         ASSESSMENT:    1. Coronary artery disease involving native coronary artery of native heart without angina pectoris   2. Essential hypertension   3. Hyperlipidemia, unspecified hyperlipidemia type      PLAN:  In order of problems listed above:  CAD with coronary CTA calcium score 298 with moderate atherosclerosis of the ostial RCA proximal to mid LAD and proximal circumflex, FFR analysis demonstrated possible flow-limiting lesion in the diagonal branch in the mid to distal LAD.  Cardiac cath 30/2150% proximal to mid LAD and 75% to 80% diagonal 1 medical therapy recommended. Still having some chest tightness and dyspnea on exertion but improved with increase HCTZ and Imdur. Consider ranexa but patient  reluctant to add another medicine at this time.  Essential hypertension  BP running low with above changes. Will reduce telmisartan to 20 mg daily. Repeat bmet today  Hyperlipidemia on atorvastatin 80 mg daily LDL 72 08/12/2019    Medication Adjustments/Labs and Tests Ordered: Current medicines are reviewed at length with the patient today.  Concerns regarding medicines are outlined above.  Medication changes, Labs and Tests ordered today are listed in the Patient Instructions below. Patient Instructions   Medication Instructions:  Your physician has recommended you make the following change in your medication:   DECREASE: telmisartan to to 20 mg once a day  *If you need a refill on your cardiac medications before your next appointment, please call your pharmacy*   Lab Work: TODAY: BMET  If you have labs (blood work) drawn today and your tests are completely normal, you will receive your results only by: Marland Kitchen MyChart Message (if you have MyChart) OR . A paper copy in the mail If you have any lab test that is abnormal or we need to change your treatment, we will call you to review the results.   Testing/Procedures: None   Follow-Up: Follow up with Dr. Tamala Julian on 12/23/19 at 4:20 PM   Other Instructions Two Gram Sodium Diet 2000 mg  What is Sodium? Sodium is a mineral found naturally in many foods. The most significant source of sodium in the diet is table salt, which is about 40% sodium.  Processed, convenience, and preserved foods also contain a large amount of sodium.  The body needs only 500 mg of sodium daily to function,  A normal diet provides more than enough sodium even if you do not use salt.  Why Limit Sodium? A build up of sodium in the body can cause thirst, increased blood pressure, shortness of breath, and water retention.  Decreasing sodium in the diet can reduce edema and risk of heart attack or stroke associated with high blood pressure.  Keep in mind that there are  many other factors involved in these health problems.  Heredity, obesity, lack of exercise, cigarette smoking, stress and what you eat all play a role.  General Guidelines:  Do not add salt at the table or in cooking.  One teaspoon of salt contains over 2 grams of sodium.  Read food labels  Avoid processed and convenience foods  Ask your dietitian before eating any foods not dicussed in the menu planning guidelines  Consult your physician if you wish to use a salt substitute or a sodium containing medication such as antacids.  Limit milk and milk products to 16 oz (2 cups) per day.  Shopping Hints:  READ LABELS!! "Dietetic" does not necessarily mean low sodium.  Salt and other sodium ingredients are often added to foods during processing.   Menu Planning Guidelines Food Group Choose More Often Avoid  Beverages (see also the milk group All fruit juices, low-sodium, salt-free vegetables juices, low-sodium carbonated beverages Regular vegetable or tomato juices, commercially softened water used for drinking or cooking  Breads and Cereals Enriched white, wheat, rye and pumpernickel bread, hard rolls and dinner rolls; muffins, cornbread and waffles; most dry cereals, cooked cereal without added salt; unsalted crackers and breadsticks; low sodium or homemade bread crumbs Bread, rolls and crackers with salted tops; quick breads; instant hot cereals; pancakes; commercial bread stuffing; self-rising flower and biscuit mixes; regular bread crumbs or cracker crumbs  Desserts and Sweets Desserts and sweets mad with mild should be within allowance Instant pudding mixes and cake mixes  Fats Butter or margarine; vegetable oils; unsalted  salad dressings, regular salad dressings limited to 1 Tbs; light, sour and heavy cream Regular salad dressings containing bacon fat, bacon bits, and salt pork; snack dips made with instant soup mixes or processed cheese; salted nuts  Fruits Most fresh, frozen and canned  fruits Fruits processed with salt or sodium-containing ingredient (some dried fruits are processed with sodium sulfites        Vegetables Fresh, frozen vegetables and low- sodium canned vegetables Regular canned vegetables, sauerkraut, pickled vegetables, and others prepared in brine; frozen vegetables in sauces; vegetables seasoned with ham, bacon or salt pork  Condiments, Sauces, Miscellaneous  Salt substitute with physician's approval; pepper, herbs, spices; vinegar, lemon or lime juice; hot pepper sauce; garlic powder, onion powder, low sodium soy sauce (1 Tbs.); low sodium condiments (ketchup, chili sauce, mustard) in limited amounts (1 tsp.) fresh ground horseradish; unsalted tortilla chips, pretzels, potato chips, popcorn, salsa (1/4 cup) Any seasoning made with salt including garlic salt, celery salt, onion salt, and seasoned salt; sea salt, rock salt, kosher salt; meat tenderizers; monosodium glutamate; mustard, regular soy sauce, barbecue, sauce, chili sauce, teriyaki sauce, steak sauce, Worcestershire sauce, and most flavored vinegars; canned gravy and mixes; regular condiments; salted snack foods, olives, picles, relish, horseradish sauce, catsup   Food preparation: Try these seasonings Meats:    Pork Sage, onion Serve with applesauce  Chicken Poultry seasoning, thyme, parsley Serve with cranberry sauce  Lamb Curry powder, rosemary, garlic, thyme Serve with mint sauce or jelly  Veal Marjoram, basil Serve with current jelly, cranberry sauce  Beef Pepper, bay leaf Serve with dry mustard, unsalted chive butter  Fish [place] leaf, dill Serve with unsalted lemon butter, unsalted parsley butter  Vegetables:    Asparagus Lemon juice   Broccoli Lemon juice   Carrots Mustard dressing parsley, mint, nutmeg, glazed with unsalted butter and sugar   Green beans Marjoram, lemon juice, nutmeg,dill seed   Tomatoes Basil, marjoram, onion   Spice /blend for [employer]" 4 tsp ground thyme 1 tsp  ground sage 3 tsp ground rosemary 4 tsp ground marjoram   Test your knowledge 1. A product that says "Salt Free" may still contain sodium. True or False 2. Garlic Powder and Hot Pepper Sauce an be used as alternative seasonings.True or False 3. Processed foods have more sodium than fresh foods.  True or False 4. Canned Vegetables have less sodium than froze True or False  WAYS TO DECREASE YOUR SODIUM INTAKE 1. Avoid the use of added salt in cooking and at the table.  Table salt (and other prepared seasonings which contain salt) is probably one of the greatest sources of sodium in the diet.  Unsalted foods can gain flavor from the sweet, sour, and butter taste sensations of herbs and spices.  Instead of using salt for seasoning, try the following seasonings with the foods listed.  Remember: how you use them to enhance natural food flavors is limited only by your creativity... Allspice-Meat, fish, eggs, fruit, peas, red and yellow vegetables Almond Extract-Fruit baked goods Anise Seed-Sweet breads, fruit, carrots, beets, cottage cheese, cookies (tastes like licorice) Basil-Meat, fish, eggs, vegetables, rice, vegetables salads, soups, sauces Bay Leaf-Meat, fish, stews, poultry Burnet-Salad, vegetables (cucumber-like flavor) Caraway Seed-Bread, cookies, cottage cheese, meat, vegetables, cheese, rice Cardamon-Baked goods, fruit, soups Celery Powder or seed-Salads, salad dressings, sauces, meatloaf, soup, bread.Do not use  celery salt Chervil-Meats, salads, fish, eggs, vegetables, cottage cheese (parsley-like flavor) Chili Power-Meatloaf, chicken cheese, corn, eggplant, egg dishes Chives-Salads cottage cheese, egg dishes, soups, vegetables,  sauces Cilantro-Salsa, casseroles Cinnamon-Baked goods, fruit, pork, lamb, chicken, carrots Cloves-Fruit, baked goods, fish, pot roast, green beans, beets, carrots Coriander-Pastry, cookies, meat, salads, cheese (lemon-orange flavor) Cumin-Meatloaf,  fish,cheese, eggs, cabbage,fruit pie (caraway flavor) [employer], fruit, eggs, fish, poultry, cottage cheese, vegetables Dill Seed-Meat, cottage cheese, poultry, vegetables, fish, salads, bread Fennel Seed-Bread, cookies, apples, pork, eggs, fish, beets, cabbage, cheese, Licorice-like flavor Garlic-(buds or powder) Salads, meat, poultry, fish, bread, butter, vegetables, potatoes.Do not  use garlic salt Ginger-Fruit, vegetables, baked goods, meat, fish, poultry Horseradish Root-Meet, vegetables, butter Lemon Juice or Extract-Vegetables, fruit, tea, baked goods, fish salads Mace-Baked goods fruit, vegetables, fish, poultry (taste like nutmeg) Maple Extract-Syrups Marjoram-Meat, chicken, fish, vegetables, breads, green salads (taste like Sage) Mint-Tea, lamb, sherbet, vegetables, desserts, carrots, cabbage Mustard, Dry or Seed-Cheese, eggs, meats, vegetables, poultry Nutmeg-Baked goods, fruit, chicken, eggs, vegetables, desserts Onion Powder-Meat, fish, poultry, vegetables, cheese, eggs, bread, rice salads (Do not use   Onion salt) Orange Extract-Desserts, baked goods Oregano-Pasta, eggs, cheese, onions, pork, lamb, fish, chicken, vegetables, green salads Paprika-Meat, fish, poultry, eggs, cheese, vegetables Parsley Flakes-Butter, vegetables, meat fish, poultry, eggs, bread, salads (certain forms may   Contain sodium Pepper-Meat fish, poultry, vegetables, eggs Peppermint Extract-Desserts, baked goods Poppy Seed-Eggs, bread, cheese, fruit dressings, baked goods, noodles, vegetables, cottage  [employer], poultry, meat, fish, cauliflower, turnips,eggs bread Saffron-Rice, bread, veal, chicken, fish, eggs Sage-Meat, fish, poultry, onions, eggplant, tomateos, pork, stews Savory-Eggs, salads, poultry, meat, rice, vegetables, soups, pork Tarragon-Meat, poultry, fish, eggs, butter, vegetables (licorice-like flavor)  Thyme-Meat, poultry, fish, eggs,  vegetables, (clover-like flavor), sauces, soups Tumeric-Salads, butter, eggs, fish, rice, vegetables (saffron-like flavor) Vanilla Extract-Baked goods, candy Vinegar-Salads, vegetables, meat marinades Walnut Extract-baked goods, candy  2. Choose your Foods Wisely   The following is a list of foods to avoid which are high in sodium:  Meats-Avoid all smoked, canned, salt cured, dried and kosher meat and fish as well as Anchovies   [employer] meats:Bologna, Liverwurst, Pastrami Canned meat or fish  Marinated herring Caviar    Pepperoni Corned Beef   Pizza Dried chipped beef  Salami Frozen breaded fish or meat Salt pork Frankfurters or hot dogs  Sardines Gefilte fish   Sausage Ham (boiled ham, Proscuitto Smoked butt    spiced ham)   Spam      TV Dinners Vegetables Canned vegetables (Regular) Relish Canned mushrooms  Sauerkraut Olives    Tomato juice Pickles  Bakery and Dessert Products Canned puddings  Cream pies Cheesecake   Decorated cakes Cookies  Beverages/Juices Tomato juice, regular  Gatorade   V-8 vegetable juice, regular  Breads and Cereals Biscuit mixes   Salted potato chips, corn chips, pretzels Bread stuffing mixes  Salted crackers and rolls Pancake and waffle mixes Self-rising flour  Seasonings Accent    Meat sauces Barbecue sauce  Meat tenderizer Catsup    Monosodium glutamate (MSG) Celery salt   Onion salt Chili sauce   Prepared mustard Garlic salt   Salt, seasoned salt, sea salt Gravy mixes   Soy sauce Horseradish   Steak sauce Ketchup   Tartar sauce Lite salt    Teriyaki sauce Marinade mixes   Worcestershire sauce  Others Baking powder   Cocoa and cocoa mixes Baking soda   Commercial casserole mixes Candy-caramels, chocolate  Dehydrated soups    Bars, fudge,nougats  Instant rice and pasta mixes Canned broth or soup  Maraschino cherries Cheese, aged and processed cheese and cheese spreads  Learning Assessment Quiz  Indicated T  (  for True) or F (for False) for each of the following statements:  1. _____ Fresh fruits and vegetables and unprocessed grains are generally low in sodium 2. _____ Water may contain a considerable amount of sodium, depending on the source 3. _____ You can always tell if a food is high in sodium by tasting it 4. _____ Certain laxatives my be high in sodium and should be avoided unless prescribed   by a physician or pharmacist 5. _____ Salt substitutes may be used freely by anyone on a sodium restricted diet 6. _____ Sodium is present in table salt, food additives and as a natural component of   most foods 7. _____ Table salt is approximately 90% sodium 8. _____ Limiting sodium intake may help prevent excess fluid accumulation in the body 9. _____ On a sodium-restricted diet, seasonings such as bouillon soy sauce, and    cooking wine should be used in place of table salt 10. _____ On an ingredient list, a product which lists monosodium glutamate as the first   ingredient is an appropriate food to include on a low sodium diet  Circle the best answer(s) to the following statements (Hint: there may be more than one correct answer)  11. On a low-sodium diet, some acceptable snack items are:    A. Olives  F. Bean dip   K. Grapefruit juice    B. Salted Pretzels G. Commercial Popcorn   L. Canned peaches    C. Carrot Sticks  H. Bouillon   M. Unsalted nuts   D. [place] fries  I. Peanut butter crackers N. Salami   E. Sweet pickles J. Tomato Juice   O. Pizza  12.  Seasonings that may be used freely on a reduced - sodium diet include   A. Lemon wedges F.Monosodium glutamate K. Celery seed    B.Soysauce   G. Pepper   L. Mustard powder   C. Sea salt  H. Cooking wine  M. Onion flakes   D. Vinegar  E. Prepared horseradish N. Salsa   E. Sage   J. Worcestershire sauce  O. [address]      Sumner Boast, PA-C  10/08/2019 12:11 PM    [facility] [address],  [place], [place]  [address] Phone: [phone]; Fax: [phone]

## 2019-10-08 ENCOUNTER — Other Ambulatory Visit: Payer: Self-pay

## 2019-10-08 ENCOUNTER — Encounter: Payer: Self-pay | Admitting: Physician Assistant

## 2019-10-08 ENCOUNTER — Ambulatory Visit (INDEPENDENT_AMBULATORY_CARE_PROVIDER_SITE_OTHER): Payer: Medicare Other | Admitting: Physician Assistant

## 2019-10-08 VITALS — BP 126/80 | HR 78 | Ht 62.0 in | Wt 184.1 lb

## 2019-10-08 DIAGNOSIS — I2510 Atherosclerotic heart disease of native coronary artery without angina pectoris: Secondary | ICD-10-CM

## 2019-10-08 DIAGNOSIS — E785 Hyperlipidemia, unspecified: Secondary | ICD-10-CM | POA: Diagnosis not present

## 2019-10-08 DIAGNOSIS — I10 Essential (primary) hypertension: Secondary | ICD-10-CM | POA: Diagnosis not present

## 2019-10-08 LAB — BASIC METABOLIC PANEL
BUN/Creatinine Ratio: 15 (ref 12–28)
BUN: 15 mg/dL (ref 8–27)
CO2: 24 mmol/L (ref 20–29)
Calcium: 9.3 mg/dL (ref 8.7–10.3)
Chloride: 100 mmol/L (ref 96–106)
Creatinine, Ser: 0.98 mg/dL (ref 0.57–1.00)
GFR calc Af Amer: 63 mL/min/{1.73_m2} (ref >59)
GFR calc non Af Amer: 55 mL/min/{1.73_m2} — ABNORMAL LOW (ref >59)
Glucose: 105 mg/dL — ABNORMAL HIGH (ref 65–99)
Potassium: 4.5 mmol/L (ref 3.5–5.2)
Sodium: 140 mmol/L (ref 134–144)

## 2019-10-08 MED ORDER — TELMISARTAN 20 MG PO TABS: 20.0000 mg | ORAL_TABLET | Freq: Every day | ORAL | 3 refills | Status: DC

## 2019-10-08 NOTE — Patient Instructions (Signed)
Medication Instructions:  Your physician has recommended you make the following change in your medication:   DECREASE: telmisartan to to 20 mg once a day  *If you need a refill on your cardiac medications before your next appointment, please call your pharmacy*   Lab Work: TODAY: BMET  If you have labs (blood work) drawn today and your tests are completely normal, you will receive your results only by: Marland Kitchen MyChart Message (if you have MyChart) OR . A paper copy in the mail If you have any lab test that is abnormal or we need to change your treatment, we will call you to review the results.   Testing/Procedures: None   Follow-Up: Follow up with Dr. Tamala Julian on 12/23/19 at 4:20 PM   Other Instructions Two Gram Sodium Diet 2000 mg  What is Sodium? Sodium is a mineral found naturally in many foods. The most significant source of sodium in the diet is table salt, which is about 40% sodium.  Processed, convenience, and preserved foods also contain a large amount of sodium.  The body needs only 500 mg of sodium daily to function,  A normal diet provides more than enough sodium even if you do not use salt.  Why Limit Sodium? A build up of sodium in the body can cause thirst, increased blood pressure, shortness of breath, and water retention.  Decreasing sodium in the diet can reduce edema and risk of heart attack or stroke associated with high blood pressure.  Keep in mind that there are many other factors involved in these health problems.  Heredity, obesity, lack of exercise, cigarette smoking, stress and what you eat all play a role.  General Guidelines:  Do not add salt at the table or in cooking.  One teaspoon of salt contains over 2 grams of sodium.  Read food labels  Avoid processed and convenience foods  Ask your dietitian before eating any foods not dicussed in the menu planning guidelines  Consult your physician if you wish to use a salt substitute or a sodium containing  medication such as antacids.  Limit milk and milk products to 16 oz (2 cups) per day.  Shopping Hints:  READ LABELS!! "Dietetic" does not necessarily mean low sodium.  Salt and other sodium ingredients are often added to foods during processing.   Menu Planning Guidelines Food Group Choose More Often Avoid  Beverages (see also the milk group All fruit juices, low-sodium, salt-free vegetables juices, low-sodium carbonated beverages Regular vegetable or tomato juices, commercially softened water used for drinking or cooking  Breads and Cereals Enriched white, wheat, rye and pumpernickel bread, hard rolls and dinner rolls; muffins, cornbread and waffles; most dry cereals, cooked cereal without added salt; unsalted crackers and breadsticks; low sodium or homemade bread crumbs Bread, rolls and crackers with salted tops; quick breads; instant hot cereals; pancakes; commercial bread stuffing; self-rising flower and biscuit mixes; regular bread crumbs or cracker crumbs  Desserts and Sweets Desserts and sweets mad with mild should be within allowance Instant pudding mixes and cake mixes  Fats Butter or margarine; vegetable oils; unsalted salad dressings, regular salad dressings limited to 1 Tbs; light, sour and heavy cream Regular salad dressings containing bacon fat, bacon bits, and salt pork; snack dips made with instant soup mixes or processed cheese; salted nuts  Fruits Most fresh, frozen and canned fruits Fruits processed with salt or sodium-containing ingredient (some dried fruits are processed with sodium sulfites        Vegetables Fresh, frozen  vegetables and low- sodium canned vegetables Regular canned vegetables, sauerkraut, pickled vegetables, and others prepared in brine; frozen vegetables in sauces; vegetables seasoned with ham, bacon or salt pork  Condiments, Sauces, Miscellaneous  Salt substitute with physician's approval; pepper, herbs, spices; vinegar, lemon or lime juice; hot pepper  sauce; garlic powder, onion powder, low sodium soy sauce (1 Tbs.); low sodium condiments (ketchup, chili sauce, mustard) in limited amounts (1 tsp.) fresh ground horseradish; unsalted tortilla chips, pretzels, potato chips, popcorn, salsa (1/4 cup) Any seasoning made with salt including garlic salt, celery salt, onion salt, and seasoned salt; sea salt, rock salt, kosher salt; meat tenderizers; monosodium glutamate; mustard, regular soy sauce, barbecue, sauce, chili sauce, teriyaki sauce, steak sauce, Worcestershire sauce, and most flavored vinegars; canned gravy and mixes; regular condiments; salted snack foods, olives, picles, relish, horseradish sauce, catsup   Food preparation: Try these seasonings Meats:    Pork Sage, onion Serve with applesauce  Chicken Poultry seasoning, thyme, parsley Serve with cranberry sauce  Lamb Curry powder, rosemary, garlic, thyme Serve with mint sauce or jelly  Veal Marjoram, basil Serve with current jelly, cranberry sauce  Beef Pepper, bay leaf Serve with dry mustard, unsalted chive butter  Fish [place] leaf, dill Serve with unsalted lemon butter, unsalted parsley butter  Vegetables:    Asparagus Lemon juice   Broccoli Lemon juice   Carrots Mustard dressing parsley, mint, nutmeg, glazed with unsalted butter and sugar   Green beans Marjoram, lemon juice, nutmeg,dill seed   Tomatoes Basil, marjoram, onion   Spice /blend for [employer]" 4 tsp ground thyme 1 tsp ground sage 3 tsp ground rosemary 4 tsp ground marjoram   Test your knowledge 1. A product that says "Salt Free" may still contain sodium. True or False 2. Garlic Powder and Hot Pepper Sauce an be used as alternative seasonings.True or False 3. Processed foods have more sodium than fresh foods.  True or False 4. Canned Vegetables have less sodium than froze True or False  WAYS TO DECREASE YOUR SODIUM INTAKE 1. Avoid the use of added salt in cooking and at the table.  Table salt (and other prepared  seasonings which contain salt) is probably one of the greatest sources of sodium in the diet.  Unsalted foods can gain flavor from the sweet, sour, and butter taste sensations of herbs and spices.  Instead of using salt for seasoning, try the following seasonings with the foods listed.  Remember: how you use them to enhance natural food flavors is limited only by your creativity... Allspice-Meat, fish, eggs, fruit, peas, red and yellow vegetables Almond Extract-Fruit baked goods Anise Seed-Sweet breads, fruit, carrots, beets, cottage cheese, cookies (tastes like licorice) Basil-Meat, fish, eggs, vegetables, rice, vegetables salads, soups, sauces Bay Leaf-Meat, fish, stews, poultry Burnet-Salad, vegetables (cucumber-like flavor) Caraway Seed-Bread, cookies, cottage cheese, meat, vegetables, cheese, rice Cardamon-Baked goods, fruit, soups Celery Powder or seed-Salads, salad dressings, sauces, meatloaf, soup, bread.Do not use  celery salt Chervil-Meats, salads, fish, eggs, vegetables, cottage cheese (parsley-like flavor) Chili Power-Meatloaf, chicken cheese, corn, eggplant, egg dishes Chives-Salads cottage cheese, egg dishes, soups, vegetables, sauces Cilantro-Salsa, casseroles Cinnamon-Baked goods, fruit, pork, lamb, chicken, carrots Cloves-Fruit, baked goods, fish, pot roast, green beans, beets, carrots Coriander-Pastry, cookies, meat, salads, cheese (lemon-orange flavor) Cumin-Meatloaf, fish,cheese, eggs, cabbage,fruit pie (caraway flavor) [employer], fruit, eggs, fish, poultry, cottage cheese, vegetables Dill Seed-Meat, cottage cheese, poultry, vegetables, fish, salads, bread Fennel Seed-Bread, cookies, apples, pork, eggs, fish, beets, cabbage, cheese, Licorice-like flavor Garlic-(buds or powder) Salads, meat, poultry,  fish, bread, butter, vegetables, potatoes.Do not  use garlic salt Ginger-Fruit, vegetables, baked goods, meat, fish, poultry Horseradish Root-Meet, vegetables,  butter Lemon Juice or Extract-Vegetables, fruit, tea, baked goods, fish salads Mace-Baked goods fruit, vegetables, fish, poultry (taste like nutmeg) Maple Extract-Syrups Marjoram-Meat, chicken, fish, vegetables, breads, green salads (taste like Sage) Mint-Tea, lamb, sherbet, vegetables, desserts, carrots, cabbage Mustard, Dry or Seed-Cheese, eggs, meats, vegetables, poultry Nutmeg-Baked goods, fruit, chicken, eggs, vegetables, desserts Onion Powder-Meat, fish, poultry, vegetables, cheese, eggs, bread, rice salads (Do not use   Onion salt) Orange Extract-Desserts, baked goods Oregano-Pasta, eggs, cheese, onions, pork, lamb, fish, chicken, vegetables, green salads Paprika-Meat, fish, poultry, eggs, cheese, vegetables Parsley Flakes-Butter, vegetables, meat fish, poultry, eggs, bread, salads (certain forms may   Contain sodium Pepper-Meat fish, poultry, vegetables, eggs Peppermint Extract-Desserts, baked goods Poppy Seed-Eggs, bread, cheese, fruit dressings, baked goods, noodles, vegetables, cottage  [employer], poultry, meat, fish, cauliflower, turnips,eggs bread Saffron-Rice, bread, veal, chicken, fish, eggs Sage-Meat, fish, poultry, onions, eggplant, tomateos, pork, stews Savory-Eggs, salads, poultry, meat, rice, vegetables, soups, pork Tarragon-Meat, poultry, fish, eggs, butter, vegetables (licorice-like flavor)  Thyme-Meat, poultry, fish, eggs, vegetables, (clover-like flavor), sauces, soups Tumeric-Salads, butter, eggs, fish, rice, vegetables (saffron-like flavor) Vanilla Extract-Baked goods, candy Vinegar-Salads, vegetables, meat marinades Walnut Extract-baked goods, candy  2. Choose your Foods Wisely   The following is a list of foods to avoid which are high in sodium:  Meats-Avoid all smoked, canned, salt cured, dried and kosher meat and fish as well as Anchovies   [employer] meats:Bologna, Liverwurst, Pastrami Canned meat  or fish  Marinated herring Caviar    Pepperoni Corned Beef   Pizza Dried chipped beef  Salami Frozen breaded fish or meat Salt pork Frankfurters or hot dogs  Sardines Gefilte fish   Sausage Ham (boiled ham, Proscuitto Smoked butt    spiced ham)   Spam      TV Dinners Vegetables Canned vegetables (Regular) Relish Canned mushrooms  Sauerkraut Olives    Tomato juice Pickles  Bakery and Dessert Products Canned puddings  Cream pies Cheesecake   Decorated cakes Cookies  Beverages/Juices Tomato juice, regular  Gatorade   V-8 vegetable juice, regular  Breads and Cereals Biscuit mixes   Salted potato chips, corn chips, pretzels Bread stuffing mixes  Salted crackers and rolls Pancake and waffle mixes Self-rising flour  Seasonings Accent    Meat sauces Barbecue sauce  Meat tenderizer Catsup    Monosodium glutamate (MSG) Celery salt   Onion salt Chili sauce   Prepared mustard Garlic salt   Salt, seasoned salt, sea salt Gravy mixes   Soy sauce Horseradish   Steak sauce Ketchup   Tartar sauce Lite salt    Teriyaki sauce Marinade mixes   Worcestershire sauce  Others Baking powder   Cocoa and cocoa mixes Baking soda   Commercial casserole mixes Candy-caramels, chocolate  Dehydrated soups    Bars, fudge,nougats  Instant rice and pasta mixes Canned broth or soup  Maraschino cherries Cheese, aged and processed cheese and cheese spreads  Learning Assessment Quiz  Indicated T (for True) or F (for False) for each of the following statements:  1. _____ Fresh fruits and vegetables and unprocessed grains are generally low in sodium 2. _____ Water may contain a considerable amount of sodium, depending on the source 3. _____ You can always tell if a food is high in sodium by tasting it 4. _____ Certain laxatives my be high in sodium and should be avoided  unless prescribed   by a physician or pharmacist 5. _____ Salt substitutes may be used freely by anyone on a sodium restricted  diet 6. _____ Sodium is present in table salt, food additives and as a natural component of   most foods 7. _____ Table salt is approximately 90% sodium 8. _____ Limiting sodium intake may help prevent excess fluid accumulation in the body 9. _____ On a sodium-restricted diet, seasonings such as bouillon soy sauce, and    cooking wine should be used in place of table salt 10. _____ On an ingredient list, a product which lists monosodium glutamate as the first   ingredient is an appropriate food to include on a low sodium diet  Circle the best answer(s) to the following statements (Hint: there may be more than one correct answer)  11. On a low-sodium diet, some acceptable snack items are:    A. Olives  F. Bean dip   K. Grapefruit juice    B. Salted Pretzels G. Commercial Popcorn   L. Canned peaches    C. Carrot Sticks  H. Bouillon   M. Unsalted nuts   D. [place] fries  I. Peanut butter crackers N. Salami   E. Sweet pickles J. Tomato Juice   O. Pizza  12.  Seasonings that may be used freely on a reduced - sodium diet include   A. Lemon wedges F.Monosodium glutamate K. Celery seed    B.Soysauce   G. Pepper   L. Mustard powder   C. Sea salt  H. Cooking wine  M. Onion flakes   D. Vinegar  E. Prepared horseradish N. Salsa   E. Sage   J. Worcestershire sauce  O. Chutney

## 2019-10-21 ENCOUNTER — Telehealth: Payer: Self-pay | Admitting: Cardiology

## 2019-10-21 ENCOUNTER — Telehealth: Payer: Self-pay | Admitting: Interventional Cardiology

## 2019-10-21 MED ORDER — RANOLAZINE ER 500 MG PO TB12
500.0000 mg | ORAL_TABLET | Freq: Two times a day (BID) | ORAL | 3 refills | Status: DC
Start: 2019-10-21 — End: 2020-10-07

## 2019-10-21 NOTE — Telephone Encounter (Signed)
Spoke with the patient and she will discontinue telmisartan. She will start taking Ranexa 500 mg BID. She will let us know if systolic BP remains below 100.

## 2019-10-21 NOTE — Telephone Encounter (Signed)
Spoke with the patient who states that her blood pressure has still been running low. Medications were recently adjusted at office visit with [name], PA-C. Imdur and HCTZ were increased, telmisartan was decreased to 20 mg daily on 06/16. Patient states that since she adjusted her medications her average BP in the morning after taking her medications have been 95/64 and HR 67. In the evenings average BP 96/64 with HR 71. Today she states that she has had some nausea and broke out in a sweat. She denies a fever. She continues to have some chest tightness when she bends over. She also reports some lightheadedness and has been feeling weak. She reports that she is staying hydrated, drinking 3-4 bottles of water per day. She has not been eating well, mostly crackers. I encouraged her to try to eat more and to rest. Also advised to hold BP meds if systolic is running below 90.

## 2019-10-21 NOTE — Telephone Encounter (Signed)
Stop Telmisartan.  Continue HCTZ and IMdur.  Add ranexa 500mg  BID and get in to see [name] in 1 week.  Call if BP remains below [id number] systolic

## 2019-10-21 NOTE — Telephone Encounter (Signed)
Pt c/o BP issue: STAT if pt c/o blurred vision, one-sided weakness or slurred speech  1. What are your last 5 BP readings? Patient states that since 10/09/19-10/21/19 her BP is averaging in the morning 95/64 HR 67 and in the evening 96/64 HR 71   2. Are you having any other symptoms (ex. Dizziness, headache, blurred vision, passed out)? Nausea    3. What is your BP issue? Low BP. Patient states that today, once she ate she got nauseated and broke out in a cold sweat. She states that she had to lay down for a couple of hours today.

## 2019-10-21 NOTE — Telephone Encounter (Signed)
error 

## 2019-11-05 NOTE — Progress Notes (Signed)
Cardiology Office Note    Date:  11/11/2019   ID:  Connie Weber, DOB 12-09-1940, MRN [id number]  PCP:  Gaynelle Arabian, MD  Cardiologist: Fransico Him, MD EPS: None  Chief Complaint  Patient presents with  . Follow-up    History of Present Illness:  Connie Weber is a 79 y.o. female with a hx of HTN, HLD, asthma, GERD and CKD stage III, ascending aortic aneurysm measuring 40 mm and CAD with Coronary CTA showing a coronary calcium score of 298 with  moderate atherosclerosis of the ostial RCA, proximal to mid LAD and proximal LCx with normal coronary origin with right dominance.  FFR analysis demonstrated possibly flow-limiting lesion in the diagonal branch and mid to distal LAD with recommendations for aggressive medical management and if continued chest discomfort then consider cardiac catheterization.    Patient continued to have chest pain dyspnea on exertion despite increase in long-acting nitrates.    Patient underwent left heart cath 08/22/2019 75-80 ostial first diagonal, 50% proximal to mid LAD RFof the LAD 0.93 recommend medical therapy for diagonal disease because PCI of the diagonal would involve the LAD and possible lead to stenting.  Recommend intensify treatment for diastolic heart failure and angina.    I saw the patient 09/16/2019 when she was having chest pain and dyspnea on exertion with little activity.  I increase HCTZ to 25 mg daily because of the elevated LVEDP and increase Imdur to 60 mg daily.  Also consider Ranexa but patient was reluctant to take any extra meds.   F/U 10/08/19 patient was doing a little better but still having some chest pain just not as frequent. BP was running low so I decreased telmisartan to 20 mg daily.Patient called in 10/21/19 with low BP and some dizziness. Wasn't eating well. Dr. Radford Pax stopped telmisartan and added ranexa.   Patient comes in for f/u. Chest pain is doing much better. Now feels nauseated all day until 5-6 pm. Started in May when  PCP started Ropinirole. List of BP from home still running low in the 70'J systolic.    Past Medical History:  Diagnosis Date  . Arthritis   . Ascending aorta dilatation (HCC)    40mm by echo 2020  . Asthma   . Cancer ([place])    basal cell   . CKD (chronic kidney disease) stage 3, GFR 30-59 ml/min   . Complication of anesthesia    difficulty waking up, feels that it was due to not having an inhaler used prior to surgery  . GERD (gastroesophageal reflux disease)   . GERD without esophagitis   . H/O hiatal hernia   . Headache(784.0)    migraines as a young adult  . History of basal cell carcinoma of skin   . Hypercholesteremia   . Hypertension   . Osteopenia   . Prediabetes     Past Surgical History:  Procedure Laterality Date  . ABDOMINAL HYSTERECTOMY    . APPENDECTOMY    . CHOLECYSTECTOMY    . COLONOSCOPY    . EYE SURGERY     cataract surgery x 2 and implants  . INTRAVASCULAR PRESSURE WIRE/FFR STUDY N/A 08/22/2019   Procedure: INTRAVASCULAR PRESSURE WIRE/FFR STUDY;  Surgeon: Belva Crome, MD;  Location: [facility];  Service: Cardiovascular;  Laterality: N/A;  . LEFT HEART CATH AND CORONARY ANGIOGRAPHY N/A 08/22/2019   Procedure: LEFT HEART CATH AND CORONARY ANGIOGRAPHY;  Surgeon: Belva Crome, MD;  Location: [facility];  Service: Cardiovascular;  Laterality: N/A;  . ORIF ANKLE FRACTURE Right 12/25/2012   Procedure: OPEN REDUCTION INTERNAL FIXATION (ORIF) RIGHT ANKLE FRACTURE;  Surgeon: Marianna Payment, MD;  Location: [facility];  Service: Orthopedics;  Laterality: Right;  . rotator cuff surgery Left 2013    Current Medications: Current Meds  Medication Sig  . albuterol (VENTOLIN HFA) 108 (90 Base) MCG/ACT inhaler Inhale 1-2 puffs into the lungs every 4 (four) hours as needed for wheezing or shortness of breath.  Marland Kitchen aspirin 81 MG EC tablet Take 1 tablet (81 mg total) by mouth daily. Swallow whole.  Marland Kitchen atorvastatin (LIPITOR) 80 MG tablet Take 1 tablet (80 mg  total) by mouth daily.  Marland Kitchen BIOTIN 5000 PO Take 5,000 mcg by mouth in the morning and at bedtime.   . Calcium Carbonate-Vitamin D (CALTRATE 600+D) 600-400 MG-UNIT per tablet Take 1 tablet by mouth 2 (two) times daily.  . Glucosamine-Chondroitin (GLUCOSAMINE CHONDR COMPLEX PO) Take 1 tablet by mouth in the morning and at bedtime.   . hydrochlorothiazide (HYDRODIURIL) 25 MG tablet Take 0.5 tablets (12.5 mg total) by mouth daily.  Marland Kitchen ibuprofen (ADVIL,MOTRIN) 200 MG tablet Take 200-400 mg by mouth every 8 (eight) hours as needed (for pain.).   Marland Kitchen isosorbide mononitrate (IMDUR) 60 MG 24 hr tablet Take 1 tablet (60 mg total) by mouth daily.  . metoprolol tartrate (LOPRESSOR) 25 MG tablet Take 1 tablet (25 mg total) by mouth 2 (two) times daily.  . Multiple Vitamin (MULTIVITAMIN WITH MINERALS) TABS tablet Take 1 tablet by mouth daily.  . niacin 500 MG tablet Take 500 mg by mouth at bedtime.  Marland Kitchen omeprazole (PRILOSEC) 40 MG capsule Take 40 mg by mouth daily.  [name] Glycol-Propyl Glycol (SYSTANE) 0.4-0.3 % SOLN Place 1 drop into both eyes 3 (three) times daily as needed (dry/irritated eyes.).  Marland Kitchen pseudoephedrine (PSUDATABS) 30 MG tablet Take 30 mg by mouth every 4 (four) hours as needed for congestion (congestion/allergies.).   Marland Kitchen ranolazine (RANEXA) 500 MG 12 hr tablet Take 1 tablet (500 mg total) by mouth 2 (two) times daily.  Marland Kitchen rOPINIRole (REQUIP) 0.5 MG tablet Take 1 tablet by mouth. 1-3 hours before bedtime  . vitamin C (ASCORBIC ACID) 500 MG tablet Take 500 mg by mouth 2 (two) times daily.  . Wheat Dextrin (EQ FIBER POWDER PO) Take 15.12 g by mouth daily. 2 heaping teaspoons  . zinc gluconate 50 MG tablet Take 1 tablet (50 mg total) by mouth daily.  . [DISCONTINUED] hydrochlorothiazide (HYDRODIURIL) 25 MG tablet Take 1 tablet (25 mg total) by mouth daily.     Allergies:   Ace inhibitors, Actonel [risedronate sodium], Atorvastatin, Fosamax [alendronate sodium], Latex, and Prednisone   Social History    Socioeconomic History  . Marital status: Married    Spouse name: Not on file  . Number of children: Not on file  . Years of education: Not on file  . Highest education level: Not on file  Occupational History  . Not on file  Tobacco Use  . Smoking status: Former Research scientist (life sciences)  . Smokeless tobacco: Never Used  Substance and Sexual Activity  . Alcohol use: No  . Drug use: No  . Sexual activity: Not on file  Other Topics Concern  . Not on file  Social History Narrative  . Not on file   Social Determinants of Health   Financial Resource Strain:   . Difficulty of Paying Living Expenses:   Food Insecurity:   . Worried About Estate manager/land agent  of Food in the Last Year:   . [place] in the Last Year:   Transportation Needs:   . Lack of Transportation (Medical):   Marland Kitchen Lack of Transportation (Non-Medical):   Physical Activity:   . Days of Exercise per Week:   . Minutes of Exercise per Session:   Stress:   . Feeling of Stress :   Social Connections:   . Frequency of Communication with Friends and Family:   . Frequency of Social Gatherings with Friends and Family:   . Attends Religious Services:   . Active Member of Clubs or Organizations:   . Attends Archivist Meetings:   Marland Kitchen Marital Status:      Family History:  The patient's family history includes Breast cancer in her sister, sister, and sister.   ROS:   Please see the history of present illness.    ROS All other systems reviewed and are negative.   PHYSICAL EXAM:   VS:  BP 118/74   Pulse 63   Ht 5\' 2"  (1.575 m)   Wt 173 lb (78.5 kg)   SpO2 98%   BMI 31.64 kg/m   Physical Exam  GEN: Well nourished, well developed, in no acute distress  Neck: no JVD, carotid bruits, or masses Cardiac:RRR; no murmurs, rubs, or gallops  Respiratory:  clear to auscultation bilaterally, normal work of breathing GI: soft, nontender, nondistended, + BS Ext: without cyanosis, clubbing, or edema, Good distal pulses  bilaterally Neuro:  Alert and Oriented x 3 Psych: euthymic mood, full affect  Wt Readings from Last 3 Encounters:  11/11/19 173 lb (78.5 kg)  10/08/19 184 lb 1.9 oz (83.5 kg)  09/16/19 185 lb 12.8 oz (84.3 kg)      Studies/Labs Reviewed:   EKG:  EKG is not ordered today.    Recent Labs: 04/22/2019: NT-Pro BNP 96 08/12/2019: ALT 20; Hemoglobin 12.6; Platelets 283 10/08/2019: BUN 15; Creatinine, Ser 0.98; Potassium 4.5; Sodium 140   Lipid Panel    Component Value Date/Time   CHOL 141 08/12/2019 1039   TRIG 113 08/12/2019 1039   HDL 49 08/12/2019 1039   CHOLHDL 2.9 08/12/2019 1039   LDLCALC 72 08/12/2019 1039    Additional studies/ records that were reviewed today include:  Cardiac catheterization 4/30/2021Left heart cath, coronary angiography, and hemodynamic recordings via right radial using real-time vascular ultrasound for arterial access Significant pain in the forearm and brachial related to catheter size relative to arteries. Left main is widely patent. Segmental 50% proximal to mid LAD narrowing.  The first diagonal arises in the middle of the segmental LAD stenosis and contains 75 to 80% stenosis ostially and into the proximal segment. Circumflex is large with no significant obstructive lesion. Right coronary is dominant with no significant obstructive lesion. RFR: LAD 0.93.  Significant is less than 0.89.  Did not assess the diagonal as it is likely hemodynamically significant.  Recommend aggressive medical therapy for diagonal disease.  I shot away from PCI all the diagonal because PCI would involve the LAD and possibility lead stenting.  Recommendation: Intensify therapy for diastolic heart failure and angina.   Echo 03/2019  IMPRESSIONS     1. Left ventricular ejection fraction, by visual estimation, is 65 to  70%. The left ventricle has hyperdynamic function. There is mildly  increased left ventricular hypertrophy.   2. Left ventricular diastolic parameters are  consistent with Grade I  diastolic dysfunction (impaired relaxation).   3. The left ventricle has no regional  wall motion abnormalities.   4. Global right ventricle has normal systolic function.The right  ventricular size is normal. No increase in right ventricular wall  thickness.   5. Left atrial size was normal.   6. Right atrial size was normal.   7. The mitral valve is normal in structure. Trivial mitral valve  regurgitation. No evidence of mitral stenosis.   8. The tricuspid valve is normal in structure. Tricuspid valve  regurgitation is not demonstrated.   9. The aortic valve is normal in structure. Aortic valve regurgitation is  not visualized. Mild aortic valve sclerosis without stenosis.  10. The pulmonic valve was normal in structure. Pulmonic valve  regurgitation is not visualized.  11. Aneurysm of the ascending aorta, measuring 40 mm.  12. The inferior vena cava is normal in size with greater than 50%  respiratory variability, suggesting right atrial pressure of 3 mmHg.   FINDINGS   Left Ventricle: Left ventricular ejection fraction, by visual estimation,  is 65 to 70%. The left ventricle has hyperdynamic function. The left  ventricle has no regional wall motion abnormalities. There is mildly  increased left ventricular hypertrophy.  Left ventricular diastolic parameters are consistent with Grade I  diastolic dysfunction (impaired relaxation). Normal left atrial pressure.   Right Ventricle: The right ventricular size is normal. No increase in  right ventricular wall thickness. Global RV systolic function is has  normal systolic function.   Left Atrium: Left atrial size was normal in size.   Right Atrium: Right atrial size was normal in size   Pericardium: There is no evidence of pericardial effusion.   Mitral Valve: The mitral valve is normal in structure. Trivial mitral  valve regurgitation. No evidence of mitral valve stenosis by observation.   Tricuspid Valve:  The tricuspid valve is normal in structure. Tricuspid  valve regurgitation is not demonstrated.   Aortic Valve: The aortic valve is normal in structure. Aortic valve  regurgitation is not visualized. Mild aortic valve sclerosis is present,  with no evidence of aortic valve stenosis.   Pulmonic Valve: The pulmonic valve was normal in structure. Pulmonic valve  regurgitation is not visualized. Pulmonic regurgitation is not visualized.   Aorta: The aortic root, ascending aorta and aortic arch are all  structurally normal, with no evidence of dilitation or obstruction. There  is an aneurysm involving the ascending aorta. The aneurysm measures 40 mm.   Venous: The inferior vena cava is normal in size with greater than 50%  respiratory variability, suggesting right atrial pressure of 3 mmHg.   IAS/Shunts: No atrial level shunt detected by color flow Doppler. There is  no evidence of a patent foramen ovale. No ventricular septal defect is  seen or detected. There is no evidence of an atrial septal defect.         ASSESSMENT:    1. Coronary artery disease involving native coronary artery of native heart without angina pectoris   2. Essential hypertension   3. Hyperlipidemia, unspecified hyperlipidemia type   4. Nausea   5. Ascending aorta dilatation (HCC)      PLAN:  In order of problems listed above:  CAD with coronary CTA calcium score 298 with moderate atherosclerosis of the ostial RCA proximal to mid LAD and proximal circumflex, FFR analysis demonstrated possible flow-limiting lesion in the diagonal branch in the mid to distal LAD.  Cardiac cath 30/2150% proximal to mid LAD and 75% to 80% diagonal 1 medical therapy recommended. Still having some chest tightness and dyspnea on  exertion but improved with increase HCTZ and Imdur.  ranexa  added 10/21/19 by Dr. Radford Pax. Chest pain improved   Essential hypertension BP continued running low so  telmisartan stopped. Still low at home  readings. Will decrease HCTZ 12.5 mg daily. Iasked her to bring her BP cuff in with her to compare next visit.    Hyperlipidemia on atorvastatin 80 mg daily LDL 72 08/12/2019    Nausea since May when Ropinirole added for restless leg. Recommend she f/u with PCP.  Dilated ascending aorta on echo 03/2019.  Dr. Radford Pax recommended limited echo 03/2020 to follow-up.  Will place order.   Medication Adjustments/Labs and Tests Ordered: Current medicines are reviewed at length with the patient today.  Concerns regarding medicines are outlined above.  Medication changes, Labs and Tests ordered today are listed in the Patient Instructions below. There are no Patient Instructions on file for this visit.   Sumner Boast, PA-C  11/11/2019 11:38 AM    [facility] [address], [place], [place]  [address] Phone: [phone]; Fax: [phone]

## 2019-11-11 ENCOUNTER — Other Ambulatory Visit: Payer: Self-pay

## 2019-11-11 ENCOUNTER — Encounter: Payer: Self-pay | Admitting: Physician Assistant

## 2019-11-11 ENCOUNTER — Ambulatory Visit (INDEPENDENT_AMBULATORY_CARE_PROVIDER_SITE_OTHER): Payer: Medicare Other | Admitting: Physician Assistant

## 2019-11-11 VITALS — BP 118/74 | HR 63 | Ht 62.0 in | Wt 173.0 lb

## 2019-11-11 DIAGNOSIS — I77810 Thoracic aortic ectasia: Secondary | ICD-10-CM

## 2019-11-11 DIAGNOSIS — I2510 Atherosclerotic heart disease of native coronary artery without angina pectoris: Secondary | ICD-10-CM

## 2019-11-11 DIAGNOSIS — I10 Essential (primary) hypertension: Secondary | ICD-10-CM

## 2019-11-11 DIAGNOSIS — R110 Nausea: Secondary | ICD-10-CM | POA: Diagnosis not present

## 2019-11-11 DIAGNOSIS — E785 Hyperlipidemia, unspecified: Secondary | ICD-10-CM | POA: Diagnosis not present

## 2019-11-11 MED ORDER — HYDROCHLOROTHIAZIDE 25 MG PO TABS
12.5000 mg | ORAL_TABLET | Freq: Every day | ORAL | 3 refills | Status: DC
Start: 2019-11-11 — End: 2020-01-07

## 2019-11-11 NOTE — Patient Instructions (Signed)
Medication Instructions:  Your physician has recommended you make the following change in your medication:   DECREASE HCTZ to 12.5mg  daily  *If you need a refill on your cardiac medications before your next appointment, please call your pharmacy*   Lab Work: None If you have labs (blood work) drawn today and your tests are completely normal, you will receive your results only by: Marland Kitchen MyChart Message (if you have MyChart) OR . A paper copy in the mail If you have any lab test that is abnormal or we need to change your treatment, we will call you to review the results.   Testing/Procedures: Your physician has requested that you have an echocardiogram. Echocardiography is a painless test that uses sound waves to create images of your heart. It provides your doctor with information about the size and shape of your heart and how well your heart's chambers and valves are working. This procedure takes approximately one hour. There are no restrictions for this procedure.     Follow-Up: At [facility], you and your health needs are our priority.  As part of our continuing mission to provide you with exceptional heart care, we have created designated Provider Care Teams.  These Care Teams include your primary Cardiologist (physician) and Advanced Practice Providers (APPs -  Physician Assistants and Nurse Practitioners) who all work together to provide you with the care you need, when you need it.  We recommend signing up for the patient portal called "MyChart".  Sign up information is provided on this After Visit Summary.  MyChart is used to connect with patients for Virtual Visits (Telemedicine).  Patients are able to view lab/test results, encounter notes, upcoming appointments, etc.  Non-urgent messages can be sent to your provider as well.   To learn more about what you can do with MyChart, go to [email or web address].    Your next appointment:   12/24/2019  The format for your next  appointment:   In Person  Provider:   Fransico Him, MD   Other Instructions Bring your blood pressure cuff to your next appointment Call your PCP for nausea medication

## 2019-12-23 ENCOUNTER — Ambulatory Visit: Payer: Medicare Other | Admitting: Interventional Cardiology

## 2019-12-24 ENCOUNTER — Encounter: Payer: Self-pay | Admitting: Cardiology

## 2019-12-24 ENCOUNTER — Ambulatory Visit (INDEPENDENT_AMBULATORY_CARE_PROVIDER_SITE_OTHER): Payer: Medicare Other | Admitting: Cardiology

## 2019-12-24 ENCOUNTER — Other Ambulatory Visit: Payer: Self-pay

## 2019-12-24 VITALS — BP 132/76 | HR 66 | Ht 62.0 in | Wt 166.2 lb

## 2019-12-24 DIAGNOSIS — I25118 Atherosclerotic heart disease of native coronary artery with other forms of angina pectoris: Secondary | ICD-10-CM

## 2019-12-24 DIAGNOSIS — I2510 Atherosclerotic heart disease of native coronary artery without angina pectoris: Secondary | ICD-10-CM

## 2019-12-24 DIAGNOSIS — I77810 Thoracic aortic ectasia: Secondary | ICD-10-CM | POA: Diagnosis not present

## 2019-12-24 DIAGNOSIS — I10 Essential (primary) hypertension: Secondary | ICD-10-CM

## 2019-12-24 DIAGNOSIS — E785 Hyperlipidemia, unspecified: Secondary | ICD-10-CM | POA: Diagnosis not present

## 2019-12-24 NOTE — Progress Notes (Signed)
Cardiology Office Note    Date:  12/24/2019   ID:  Connie Weber, DOB 13-Aug-1940, MRN [id number]  PCP:  Gaynelle Arabian, MD  Cardiologist: Fransico Him, MD EPS: None  Chief Complaint  Patient presents with  . Coronary Artery Disease  . Hypertension  . Hyperlipidemia    History of Present Illness:  Connie Weber is a 79 y.o. female with a hx of HTN, HLD, asthma, GERD and CKD stage III, ascending aortic aneurysm measuring 40 mm and CAD with Coronary CTA showing a coronary calcium score of 298 with  moderate atherosclerosis of the ostial RCA, proximal to mid LAD and proximal LCx with normal coronary origin with right dominance.  FFR analysis demonstrated possibly flow-limiting lesion in the diagonal branch and mid to distal LAD with recommendations for aggressive medical management and if continued chest discomfort then consider cardiac catheterization.    Patient continued to have chest pain dyspnea on exertion despite increase in long-acting nitrates.    Patient underwent left heart cath 08/22/2019 75-80% ostial first diagonal, 50% proximal to mid LAD with FFR of the LAD 0.93 recommended medical therapy for diagonal disease because PCI of the diagonal would involve the LAD and possible lead to stenting.  Recommended intensify treatment for diastolic heart failure and angina.   [name], PA saw the patient 09/16/2019 when she was having chest pain and dyspnea on exertion with little activity. She increased HCTZ to 25 mg daily because of the elevated LVEDP and increased Imdur to 60 mg daily.     F/U 10/08/19 patient was doing a little better but still having some chest pain just not as frequent. BP was running low so telmisartan was decreased to 20 mg daily. Patient called in 10/21/19 with low BP and some dizziness and telmisartan was stopped and added ranexa.   She is here today for followup and is doing well.  She is doing much better since we saw her last.  She rarely has CP and if she does  it is just a brief stabbing pain.  She has chronic DOE that is stable.  She denies any PND, orthopnea, LE edema, dizziness, palpitations or syncope. She is compliant with he meds and is tolerating meds with no SE.  She would like to come off of atorvastatin due to muscle aches and feeling that she has the flu.  Past Medical History:  Diagnosis Date  . Arthritis   . Ascending aorta dilatation (HCC)    37mm by echo 2020  . Asthma   . Cancer ([place])    basal cell   . CKD (chronic kidney disease) stage 3, GFR 30-59 ml/min   . Complication of anesthesia    difficulty waking up, feels that it was due to not having an inhaler used prior to surgery  . GERD (gastroesophageal reflux disease)   . GERD without esophagitis   . H/O hiatal hernia   . Headache(784.0)    migraines as a young adult  . History of basal cell carcinoma of skin   . Hypercholesteremia   . Hypertension   . Osteopenia   . Prediabetes     Past Surgical History:  Procedure Laterality Date  . ABDOMINAL HYSTERECTOMY    . APPENDECTOMY    . CHOLECYSTECTOMY    . COLONOSCOPY    . EYE SURGERY     cataract surgery x 2 and implants  . INTRAVASCULAR PRESSURE WIRE/FFR STUDY N/A 08/22/2019   Procedure: INTRAVASCULAR PRESSURE WIRE/FFR STUDY;  Surgeon:  Belva Crome, MD;  Location: [facility];  Service: Cardiovascular;  Laterality: N/A;  . LEFT HEART CATH AND CORONARY ANGIOGRAPHY N/A 08/22/2019   Procedure: LEFT HEART CATH AND CORONARY ANGIOGRAPHY;  Surgeon: Belva Crome, MD;  Location: [facility];  Service: Cardiovascular;  Laterality: N/A;  . ORIF ANKLE FRACTURE Right 12/25/2012   Procedure: OPEN REDUCTION INTERNAL FIXATION (ORIF) RIGHT ANKLE FRACTURE;  Surgeon: Marianna Payment, MD;  Location: [facility];  Service: Orthopedics;  Laterality: Right;  . rotator cuff surgery Left 2013    Current Medications: Current Meds  Medication Sig  . albuterol (VENTOLIN HFA) 108 (90 Base) MCG/ACT inhaler Inhale 1-2 puffs into the  lungs every 4 (four) hours as needed for wheezing or shortness of breath.  Marland Kitchen aspirin 81 MG EC tablet Take 1 tablet (81 mg total) by mouth daily. Swallow whole.  Marland Kitchen atorvastatin (LIPITOR) 80 MG tablet Take 1 tablet (80 mg total) by mouth daily.  Marland Kitchen BIOTIN 5000 PO Take 5,000 mcg by mouth in the morning and at bedtime.   . Calcium Carbonate-Vitamin D (CALTRATE 600+D) 600-400 MG-UNIT per tablet Take 1 tablet by mouth 2 (two) times daily.  . Glucosamine-Chondroitin (GLUCOSAMINE CHONDR COMPLEX PO) Take 1 tablet by mouth in the morning and at bedtime.   . hydrochlorothiazide (HYDRODIURIL) 25 MG tablet Take 0.5 tablets (12.5 mg total) by mouth daily.  Marland Kitchen ibuprofen (ADVIL,MOTRIN) 200 MG tablet Take 200-400 mg by mouth every 8 (eight) hours as needed (for pain.).   Marland Kitchen isosorbide mononitrate (IMDUR) 60 MG 24 hr tablet Take 1 tablet (60 mg total) by mouth daily.  . metoprolol tartrate (LOPRESSOR) 25 MG tablet Take 1 tablet (25 mg total) by mouth 2 (two) times daily.  . Multiple Vitamin (MULTIVITAMIN WITH MINERALS) TABS tablet Take 1 tablet by mouth daily.  . niacin 500 MG tablet Take 500 mg by mouth at bedtime.  Marland Kitchen omeprazole (PRILOSEC) 40 MG capsule Take 40 mg by mouth daily.  [name] Glycol-Propyl Glycol (SYSTANE) 0.4-0.3 % SOLN Place 1 drop into both eyes 3 (three) times daily as needed (dry/irritated eyes.).  Marland Kitchen pseudoephedrine (PSUDATABS) 30 MG tablet Take 30 mg by mouth every 4 (four) hours as needed for congestion (congestion/allergies.).   Marland Kitchen ranolazine (RANEXA) 500 MG 12 hr tablet Take 1 tablet (500 mg total) by mouth 2 (two) times daily.  Marland Kitchen rOPINIRole (REQUIP) 0.5 MG tablet Take 1 tablet by mouth. 1-3 hours before bedtime  . vitamin C (ASCORBIC ACID) 500 MG tablet Take 500 mg by mouth 2 (two) times daily.  . Wheat Dextrin (EQ FIBER POWDER PO) Take 15.12 g by mouth daily. 2 heaping teaspoons  . zinc gluconate 50 MG tablet Take 1 tablet (50 mg total) by mouth daily.     Allergies:   Ace inhibitors,  Actonel [risedronate sodium], Atorvastatin, Fosamax [alendronate sodium], Latex, and Prednisone   Social History   Socioeconomic History  . Marital status: Married    Spouse name: Not on file  . Number of children: Not on file  . Years of education: Not on file  . Highest education level: Not on file  Occupational History  . Not on file  Tobacco Use  . Smoking status: Former Research scientist (life sciences)  . Smokeless tobacco: Never Used  Substance and Sexual Activity  . Alcohol use: No  . Drug use: No  . Sexual activity: Not on file  Other Topics Concern  . Not on file  Social History Narrative  . Not on  file   Social Determinants of Health   Financial Resource Strain:   . Difficulty of Paying Living Expenses: Not on file  Food Insecurity:   . Worried About Charity fundraiser in the Last Year: Not on file  . Ran Out of Food in the Last Year: Not on file  Transportation Needs:   . Lack of Transportation (Medical): Not on file  . Lack of Transportation (Non-Medical): Not on file  Physical Activity:   . Days of Exercise per Week: Not on file  . Minutes of Exercise per Session: Not on file  Stress:   . Feeling of Stress : Not on file  Social Connections:   . Frequency of Communication with Friends and Family: Not on file  . Frequency of Social Gatherings with Friends and Family: Not on file  . Attends Religious Services: Not on file  . Active Member of Clubs or Organizations: Not on file  . Attends Archivist Meetings: Not on file  . Marital Status: Not on file     Family History:  The patient's family history includes Breast cancer in her sister, sister, and sister.   ROS:   Please see the history of present illness.    ROS All other systems reviewed and are negative.   PHYSICAL EXAM:   VS:  BP 132/76   Pulse 66   Ht 5\' 2"  (1.575 m)   Wt 166 lb 3.2 oz (75.4 kg)   SpO2 92%   BMI 30.40 kg/m   Physical Exam  GEN: Well nourished, well developed in no acute  distress HEENT: Normal NECK: No JVD; No carotid bruits LYMPHATICS: No lymphadenopathy CARDIAC:RRR, no murmurs, rubs, gallops RESPIRATORY:  Clear to auscultation without rales, wheezing or rhonchi  ABDOMEN: Soft, non-tender, non-distended MUSCULOSKELETAL:  No edema; No deformity  SKIN: Warm and dry NEUROLOGIC:  Alert and oriented x 3 PSYCHIATRIC:  Normal affect    Wt Readings from Last 3 Encounters:  12/24/19 166 lb 3.2 oz (75.4 kg)  11/11/19 173 lb (78.5 kg)  10/08/19 184 lb 1.9 oz (83.5 kg)      Studies/Labs Reviewed:   EKG:  EKG is not ordered today.     Recent Labs: 04/22/2019: NT-Pro BNP 96 08/12/2019: ALT 20; Hemoglobin 12.6; Platelets 283 10/08/2019: BUN 15; Creatinine, Ser 0.98; Potassium 4.5; Sodium 140   Lipid Panel    Component Value Date/Time   CHOL 141 08/12/2019 1039   TRIG 113 08/12/2019 1039   HDL 49 08/12/2019 1039   CHOLHDL 2.9 08/12/2019 1039   LDLCALC 72 08/12/2019 1039    Additional studies/ records that were reviewed today include:  Cardiac catheterization 4/30/2021Left heart cath, coronary angiography, and hemodynamic recordings via right radial using real-time vascular ultrasound for arterial access Significant pain in the forearm and brachial related to catheter size relative to arteries. Left main is widely patent. Segmental 50% proximal to mid LAD narrowing.  The first diagonal arises in the middle of the segmental LAD stenosis and contains 75 to 80% stenosis ostially and into the proximal segment. Circumflex is large with no significant obstructive lesion. Right coronary is dominant with no significant obstructive lesion. RFR: LAD 0.93.  Significant is less than 0.89.  Did not assess the diagonal as it is likely hemodynamically significant.  Recommend aggressive medical therapy for diagonal disease.  I shot away from PCI all the diagonal because PCI would involve the LAD and possibility lead stenting.  Recommendation: Intensify therapy for  diastolic heart failure and  angina.   Echo 03/2019  IMPRESSIONS     1. Left ventricular ejection fraction, by visual estimation, is 65 to  70%. The left ventricle has hyperdynamic function. There is mildly  increased left ventricular hypertrophy.   2. Left ventricular diastolic parameters are consistent with Grade I  diastolic dysfunction (impaired relaxation).   3. The left ventricle has no regional wall motion abnormalities.   4. Global right ventricle has normal systolic function.The right  ventricular size is normal. No increase in right ventricular wall  thickness.   5. Left atrial size was normal.   6. Right atrial size was normal.   7. The mitral valve is normal in structure. Trivial mitral valve  regurgitation. No evidence of mitral stenosis.   8. The tricuspid valve is normal in structure. Tricuspid valve  regurgitation is not demonstrated.   9. The aortic valve is normal in structure. Aortic valve regurgitation is  not visualized. Mild aortic valve sclerosis without stenosis.  10. The pulmonic valve was normal in structure. Pulmonic valve  regurgitation is not visualized.  11. Aneurysm of the ascending aorta, measuring 40 mm.  12. The inferior vena cava is normal in size with greater than 50%  respiratory variability, suggesting right atrial pressure of 3 mmHg.   FINDINGS   Left Ventricle: Left ventricular ejection fraction, by visual estimation,  is 65 to 70%. The left ventricle has hyperdynamic function. The left  ventricle has no regional wall motion abnormalities. There is mildly  increased left ventricular hypertrophy.  Left ventricular diastolic parameters are consistent with Grade I  diastolic dysfunction (impaired relaxation). Normal left atrial pressure.   Right Ventricle: The right ventricular size is normal. No increase in  right ventricular wall thickness. Global RV systolic function is has  normal systolic function.   Left Atrium: Left atrial size was  normal in size.   Right Atrium: Right atrial size was normal in size   Pericardium: There is no evidence of pericardial effusion.   Mitral Valve: The mitral valve is normal in structure. Trivial mitral  valve regurgitation. No evidence of mitral valve stenosis by observation.   Tricuspid Valve: The tricuspid valve is normal in structure. Tricuspid  valve regurgitation is not demonstrated.   Aortic Valve: The aortic valve is normal in structure. Aortic valve  regurgitation is not visualized. Mild aortic valve sclerosis is present,  with no evidence of aortic valve stenosis.   Pulmonic Valve: The pulmonic valve was normal in structure. Pulmonic valve  regurgitation is not visualized. Pulmonic regurgitation is not visualized.   Aorta: The aortic root, ascending aorta and aortic arch are all  structurally normal, with no evidence of dilitation or obstruction. There  is an aneurysm involving the ascending aorta. The aneurysm measures 40 mm.   Venous: The inferior vena cava is normal in size with greater than 50%  respiratory variability, suggesting right atrial pressure of 3 mmHg.   IAS/Shunts: No atrial level shunt detected by color flow Doppler. There is  no evidence of a patent foramen ovale. No ventricular septal defect is  seen or detected. There is no evidence of an atrial septal defect.      ASSESSMENT:    1. Coronary artery disease of native artery of native heart with stable angina pectoris ([place])   2. Essential hypertension   3. Hyperlipidemia, unspecified hyperlipidemia type   4. Ascending aorta dilatation (HCC)      PLAN:  In order of problems listed above:  1.  ASCAD -coronary  CTA calcium score 298 with moderate atherosclerosis of the ostial RCA proximal to mid LAD and proximal circumflex, FFR analysis demonstrated possible flow-limiting lesion in the diagonal branch in the mid to distal LAD.   -Cardiac cath 30/2150% proximal to mid LAD and 75% to 80% diagonal 1  >> medical therapy recommended.  -HCTZ increased for LVEDP and Imdur increased for ongoing CP -Ranexa added 10/21/19 with improvement in CP -continue ASA 81mg  daily, statin, Imdur 60mg  daily, Lopressor 25mg  BID and Ranexa 500mg  BID   2.  Essential hypertension  -BP controlled -continue Lopressor 25mg  BID and HCTZ 12.5mg  daily   3.  Hyperlipidemia  -LDL goal < 70 -LDL was 72 in April 2021 -continue Atorvastatin 80mg  daily -she says that she is having a lot of problems with muscle aches and flu like feelings and wants to get off of it.   -I will refer her to lipid clinic to discuss PCSK9i -she wants to go back on Lovastatin but I explained that she needs a more potent statin.  May be able to decrease atorva and add Repatha or change to Cresto  4.  Dilated ascending aorta  -noted on echo 03/2019 measuring 65mm -repeat echo 03/2020 -BP controlled -continue statin and BB   Medication Adjustments/Labs and Tests Ordered: Current medicines are reviewed at length with the patient today.  Concerns regarding medicines are outlined above.  Medication changes, Labs and Tests ordered today are listed in the Patient Instructions below. There are no Patient Instructions on file for this visit.   Signed, Fransico Him, MD  12/24/2019 9:16 AM    [facility] [place], [place], [place]  [address] Phone: [phone]; Fax: [phone]

## 2019-12-24 NOTE — Patient Instructions (Signed)
Medication Instructions:  Your physician recommends that you continue on your current medications as directed. Please refer to the Current Medication list given to you today.   *If you need a refill on your cardiac medications before your next appointment, please call your pharmacy*  Follow-Up: At [facility], you and your health needs are our priority.  As part of our continuing mission to provide you with exceptional heart care, we have created designated Provider Care Teams.  These Care Teams include your primary Cardiologist (physician) and Advanced Practice Providers (APPs -  Physician Assistants and Nurse Practitioners) who all work together to provide you with the care you need, when you need it.   Your next appointment:   6 month(s)  The format for your next appointment:   In Person  Provider:   Ermalinda Barrios, PA-C  Follow up with Dr. Radford Pax in 1 year.

## 2019-12-24 NOTE — Addendum Note (Signed)
Addended by: Antonieta Iba on: 12/24/2019 09:25 AM   Modules accepted: Orders

## 2019-12-25 DIAGNOSIS — R5383 Other fatigue: Secondary | ICD-10-CM | POA: Diagnosis not present

## 2019-12-25 DIAGNOSIS — M791 Myalgia, unspecified site: Secondary | ICD-10-CM | POA: Diagnosis not present

## 2019-12-25 DIAGNOSIS — R300 Dysuria: Secondary | ICD-10-CM | POA: Diagnosis not present

## 2019-12-25 DIAGNOSIS — Z23 Encounter for immunization: Secondary | ICD-10-CM | POA: Diagnosis not present

## 2020-01-06 NOTE — Progress Notes (Signed)
Patient ID: Connie Weber                 DOB: 1940/10/25                    MRN: [id number]     HPI: Connie Weber is a 79 y.o. female patient referred to lipid clinic by Dr. Radford Pax. PMH is significant for HTN, HLD, ascending aortic aneurysm, CAD (coronary calcium score 298), CKD stage III, asthma, GERD. Patient saw Dr. Radford Pax on 12/24/19. At that time, patient complained of muscle aches and flu-like symptoms on atorvastatin.  Patient presents to clinic today for follow-up. She reports stopping atorvastatin on 9/2 as instructed by her PCP. Within 5-6 days, patient started to feel much better and does not want to go back on atorvastatin. Patient reports using lovastatin in the past and did not experience any muscle aches on it.  Current Medications: Atorvastatin 80 mg daily. Niacin 500 mg at bedtime. Intolerances: Atorvastatin Risk Factors: HTN, HLD, CAD, CKD, age >46 LDL goal: <70 mg/dL  Diet: Eats mixed greens (carrots, celery, green pepper, apple, cranberry), Greek yogurt, walnuts. Eats chicken once in a while (roasted or grilled). Adheres to a low-sodium diet  Exercise: Rides stationary bicycle about 30 min a day, starting 1-2 weeks ago. Has been walking outside more recently due to weather cooling down. Does weight exercises with 1 lb weights.  Family History: Breast cancer (sister)  Social History: Former smoker  Labs: Lipid panel (08/12/19): LDL 72, HDL 49, TG 113, TC 141 [on atorvastatin 80 mg]  Past Medical History:  Diagnosis Date  . Arthritis   . Ascending aorta dilatation (HCC)    78mm by echo 2020  . Asthma   . Cancer ([place])    basal cell   . CKD (chronic kidney disease) stage 3, GFR 30-59 ml/min   . Complication of anesthesia    difficulty waking up, feels that it was due to not having an inhaler used prior to surgery  . GERD (gastroesophageal reflux disease)   . GERD without esophagitis   . H/O hiatal hernia   . Headache(784.0)    migraines as a young adult  . History of  basal cell carcinoma of skin   . Hypercholesteremia   . Hypertension   . Osteopenia   . Prediabetes     Current Outpatient Medications on File Prior to Visit  Medication Sig Dispense Refill  . albuterol (VENTOLIN HFA) 108 (90 Base) MCG/ACT inhaler Inhale 1-2 puffs into the lungs every 4 (four) hours as needed for wheezing or shortness of breath.    Marland Kitchen aspirin 81 MG EC tablet Take 1 tablet (81 mg total) by mouth daily. Swallow whole. 30 tablet 12  . atorvastatin (LIPITOR) 80 MG tablet Take 1 tablet (80 mg total) by mouth daily. 90 tablet 3  . BIOTIN 5000 PO Take 5,000 mcg by mouth in the morning and at bedtime.     . Calcium Carbonate-Vitamin D (CALTRATE 600+D) 600-400 MG-UNIT per tablet Take 1 tablet by mouth 2 (two) times daily.    . Glucosamine-Chondroitin (GLUCOSAMINE CHONDR COMPLEX PO) Take 1 tablet by mouth in the morning and at bedtime.     . hydrochlorothiazide (HYDRODIURIL) 25 MG tablet Take 0.5 tablets (12.5 mg total) by mouth daily. 45 tablet 3  . ibuprofen (ADVIL,MOTRIN) 200 MG tablet Take 200-400 mg by mouth every 8 (eight) hours as needed (for pain.).     Marland Kitchen isosorbide mononitrate (IMDUR) 60 MG 24  hr tablet Take 1 tablet (60 mg total) by mouth daily. 90 tablet 3  . metoprolol tartrate (LOPRESSOR) 25 MG tablet Take 1 tablet (25 mg total) by mouth 2 (two) times daily. 180 tablet 3  . Multiple Vitamin (MULTIVITAMIN WITH MINERALS) TABS tablet Take 1 tablet by mouth daily.    . niacin 500 MG tablet Take 500 mg by mouth at bedtime.    Marland Kitchen omeprazole (PRILOSEC) 40 MG capsule Take 40 mg by mouth daily.    [name] Glycol-Propyl Glycol (SYSTANE) 0.4-0.3 % SOLN Place 1 drop into both eyes 3 (three) times daily as needed (dry/irritated eyes.).    Marland Kitchen pseudoephedrine (PSUDATABS) 30 MG tablet Take 30 mg by mouth every 4 (four) hours as needed for congestion (congestion/allergies.).     Marland Kitchen ranolazine (RANEXA) 500 MG 12 hr tablet Take 1 tablet (500 mg total) by mouth 2 (two) times daily. 180 tablet  3  . rOPINIRole (REQUIP) 0.5 MG tablet Take 1 tablet by mouth. 1-3 hours before bedtime    . vitamin C (ASCORBIC ACID) 500 MG tablet Take 500 mg by mouth 2 (two) times daily.    . Wheat Dextrin (EQ FIBER POWDER PO) Take 15.12 g by mouth daily. 2 heaping teaspoons    . zinc gluconate 50 MG tablet Take 1 tablet (50 mg total) by mouth daily. 30 tablet 0   No current facility-administered medications on file prior to visit.    Allergies  Allergen Reactions  . Ace Inhibitors     Other reaction(s): do not remember  . Actonel [Risedronate Sodium]     gerd  . Atorvastatin Other (See Comments)     myalgias (09/2012)  . Fosamax [Alendronate Sodium] Nausea Only  . Latex Itching    Other reaction(s): itching  . Prednisone Other (See Comments)    hallucinations Other reaction(s): hallucinations    Assessment/Plan:  1. Hyperlipidemia - Patient's LDL is slightly above goal of <70 mg/dL. Patient is unable to tolerate atorvastatin due to muscle aches and flu-like symptoms. We will start rosuvastatin 20 mg once daily to see if patient is able to tolerate this better. Patient is agreeable to this plan. Encouraged patient to continue healthy lifestyle habits of heart-healthy diet and daily exercise. Also counseled patient that she can stop niacin if she would like, as this does not have any cardioprotective data. Follow-up lipid panel scheduled in 3 months.  Pt seen with Esmeralda Links (PharmD Candidate 2022)  Tusayan. Supple, PharmD, BCACP, [place] [address], [place], [place] [address] Phone: [phone]; Fax: [phone] 01/07/2020 11:44 AM

## 2020-01-07 ENCOUNTER — Ambulatory Visit (INDEPENDENT_AMBULATORY_CARE_PROVIDER_SITE_OTHER): Payer: Medicare Other | Admitting: Pharmacist

## 2020-01-07 ENCOUNTER — Other Ambulatory Visit: Payer: Self-pay

## 2020-01-07 DIAGNOSIS — I2510 Atherosclerotic heart disease of native coronary artery without angina pectoris: Secondary | ICD-10-CM | POA: Diagnosis not present

## 2020-01-07 DIAGNOSIS — E785 Hyperlipidemia, unspecified: Secondary | ICD-10-CM

## 2020-01-07 DIAGNOSIS — I25118 Atherosclerotic heart disease of native coronary artery with other forms of angina pectoris: Secondary | ICD-10-CM

## 2020-01-07 MED ORDER — ROSUVASTATIN CALCIUM 20 MG PO TABS: 20.0000 mg | ORAL_TABLET | Freq: Every day | ORAL | 11 refills | Status: DC

## 2020-01-07 NOTE — Patient Instructions (Addendum)
It was nice to meet you today  Your LDL (bad cholesterol) goal is less than 70  Start taking rosuvastatin (Crestor) 20mg  once a day  Stop taking niacin  Continue to stay active with exercise and eat a heart healthy diet  Recheck fasting cholesterol on December 23rd, same day as your echo  Call with any trouble tolerating your statin [phone]

## 2020-02-06 ENCOUNTER — Other Ambulatory Visit: Payer: Self-pay | Admitting: Family Medicine

## 2020-02-06 DIAGNOSIS — Z1231 Encounter for screening mammogram for malignant neoplasm of breast: Secondary | ICD-10-CM

## 2020-03-16 ENCOUNTER — Ambulatory Visit
Admission: RE | Admit: 2020-03-16 | Discharge: 2020-03-16 | Disposition: A | Discharge status: Home / Self Care | Payer: Medicare Other | Source: Ambulatory Visit | Attending: Family Medicine | Admitting: Family Medicine

## 2020-03-16 ENCOUNTER — Other Ambulatory Visit: Payer: Self-pay

## 2020-03-16 DIAGNOSIS — I5030 Unspecified diastolic (congestive) heart failure: Secondary | ICD-10-CM | POA: Diagnosis not present

## 2020-03-16 DIAGNOSIS — G2581 Restless legs syndrome: Secondary | ICD-10-CM | POA: Diagnosis not present

## 2020-03-16 DIAGNOSIS — I25119 Atherosclerotic heart disease of native coronary artery with unspecified angina pectoris: Secondary | ICD-10-CM | POA: Diagnosis not present

## 2020-03-16 DIAGNOSIS — I209 Angina pectoris, unspecified: Secondary | ICD-10-CM | POA: Diagnosis not present

## 2020-03-16 DIAGNOSIS — Z0000 Encounter for general adult medical examination without abnormal findings: Secondary | ICD-10-CM | POA: Diagnosis not present

## 2020-03-16 DIAGNOSIS — M899 Disorder of bone, unspecified: Secondary | ICD-10-CM | POA: Diagnosis not present

## 2020-03-16 DIAGNOSIS — Z1389 Encounter for screening for other disorder: Secondary | ICD-10-CM | POA: Diagnosis not present

## 2020-03-16 DIAGNOSIS — Z1231 Encounter for screening mammogram for malignant neoplasm of breast: Secondary | ICD-10-CM

## 2020-03-16 DIAGNOSIS — K219 Gastro-esophageal reflux disease without esophagitis: Secondary | ICD-10-CM | POA: Diagnosis not present

## 2020-03-16 DIAGNOSIS — E7800 Pure hypercholesterolemia, unspecified: Secondary | ICD-10-CM | POA: Diagnosis not present

## 2020-03-16 DIAGNOSIS — I10 Essential (primary) hypertension: Secondary | ICD-10-CM | POA: Diagnosis not present

## 2020-03-16 DIAGNOSIS — R7309 Other abnormal glucose: Secondary | ICD-10-CM | POA: Diagnosis not present

## 2020-03-16 DIAGNOSIS — N183 Chronic kidney disease, stage 3 unspecified: Secondary | ICD-10-CM | POA: Diagnosis not present

## 2020-03-17 DIAGNOSIS — Z23 Encounter for immunization: Secondary | ICD-10-CM | POA: Diagnosis not present

## 2020-04-15 ENCOUNTER — Ambulatory Visit (HOSPITAL_COMMUNITY): Payer: Medicare Other | Attending: Internal Medicine

## 2020-04-15 ENCOUNTER — Other Ambulatory Visit: Payer: Self-pay

## 2020-04-15 ENCOUNTER — Other Ambulatory Visit: Payer: Medicare Other | Admitting: *Deleted

## 2020-04-15 DIAGNOSIS — E785 Hyperlipidemia, unspecified: Secondary | ICD-10-CM | POA: Diagnosis not present

## 2020-04-15 DIAGNOSIS — N189 Chronic kidney disease, unspecified: Secondary | ICD-10-CM | POA: Insufficient documentation

## 2020-04-15 DIAGNOSIS — I25118 Atherosclerotic heart disease of native coronary artery with other forms of angina pectoris: Secondary | ICD-10-CM | POA: Diagnosis not present

## 2020-04-15 DIAGNOSIS — I77810 Thoracic aortic ectasia: Secondary | ICD-10-CM | POA: Diagnosis not present

## 2020-04-15 DIAGNOSIS — R7303 Prediabetes: Secondary | ICD-10-CM | POA: Diagnosis not present

## 2020-04-15 DIAGNOSIS — I2510 Atherosclerotic heart disease of native coronary artery without angina pectoris: Secondary | ICD-10-CM

## 2020-04-15 DIAGNOSIS — I129 Hypertensive chronic kidney disease with stage 1 through stage 4 chronic kidney disease, or unspecified chronic kidney disease: Secondary | ICD-10-CM | POA: Diagnosis not present

## 2020-04-15 LAB — ECHOCARDIOGRAM LIMITED
Area-P 1/2: 2.99 cm2
S' Lateral: 1.8 cm

## 2020-04-16 LAB — HEPATIC FUNCTION PANEL
ALT: 14 IU/L (ref 0–32)
AST: 15 IU/L (ref 0–40)
Albumin: 4.5 g/dL (ref 3.7–4.7)
Alkaline Phosphatase: 58 IU/L (ref 44–121)
Bilirubin Total: 0.3 mg/dL (ref 0.0–1.2)
Bilirubin, Direct: 0.11 mg/dL (ref 0.00–0.40)
Total Protein: 6.4 g/dL (ref 6.0–8.5)

## 2020-04-16 LAB — LIPID PANEL
Chol/HDL Ratio: 3 ratio (ref 0.0–4.4)
Cholesterol, Total: 151 mg/dL (ref 100–199)
HDL: 50 mg/dL (ref >39)
LDL Chol Calc (NIH): 88 mg/dL (ref 0–99)
Triglycerides: 64 mg/dL (ref 0–149)
VLDL Cholesterol Cal: 13 mg/dL (ref 5–40)

## 2020-04-19 ENCOUNTER — Telehealth: Payer: Self-pay

## 2020-04-19 DIAGNOSIS — E785 Hyperlipidemia, unspecified: Secondary | ICD-10-CM

## 2020-04-19 MED ORDER — ROSUVASTATIN CALCIUM 40 MG PO TABS
40.0000 mg | ORAL_TABLET | Freq: Every day | ORAL | 3 refills | Status: DC
Start: 2020-04-19 — End: 2021-05-02

## 2020-04-19 NOTE — Telephone Encounter (Signed)
The patient has been notified of the result and verbalized understanding.  All questions (if any) were answered. Patient is willing to increase Crestor to 40 mg daily. She will repeat lab work on 2/08. Results from echocardiogram were also reviewed with patient.   Dyann Kief, PA-C  04/19/2020 8:22 AM EST      Echo shows normal heart function. dilated aorta slightly more than last year at 42 mm. Continue to monitor with yearly echos

## 2020-04-19 NOTE — Telephone Encounter (Signed)
-----   Message from Quintella Reichert, MD sent at 04/17/2020  5:46 PM EST ----- LDL not at goal - please find out if patient is willing to increase Crestor to 40mg  daily and if yes then repeat FLP and ALTin 6 weeks

## 2020-05-03 ENCOUNTER — Other Ambulatory Visit: Payer: Self-pay | Admitting: Cardiology

## 2020-06-01 ENCOUNTER — Other Ambulatory Visit: Payer: Medicare Other

## 2020-06-01 ENCOUNTER — Other Ambulatory Visit: Payer: Self-pay

## 2020-06-01 DIAGNOSIS — E785 Hyperlipidemia, unspecified: Secondary | ICD-10-CM | POA: Diagnosis not present

## 2020-06-01 LAB — ALT: ALT: 19 IU/L (ref 0–32)

## 2020-06-01 LAB — LIPID PANEL
Chol/HDL Ratio: 3 ratio (ref 0.0–4.4)
Cholesterol, Total: 163 mg/dL (ref 100–199)
HDL: 54 mg/dL (ref >39)
LDL Chol Calc (NIH): 91 mg/dL (ref 0–99)
Triglycerides: 97 mg/dL (ref 0–149)
VLDL Cholesterol Cal: 18 mg/dL (ref 5–40)

## 2020-06-03 ENCOUNTER — Telehealth: Payer: Self-pay | Admitting: Cardiology

## 2020-06-03 DIAGNOSIS — E785 Hyperlipidemia, unspecified: Secondary | ICD-10-CM

## 2020-06-03 MED ORDER — EZETIMIBE 10 MG PO TABS: 10.0000 mg | ORAL_TABLET | Freq: Every day | ORAL | 3 refills | Status: DC

## 2020-06-03 NOTE — Telephone Encounter (Signed)
    Pt is returning Connie Weber's call for her lab result

## 2020-06-03 NOTE — Telephone Encounter (Signed)
Harlon Flor Supple, RPH-CPP  06/02/2020 1:12 PM EST      Recommend adding ezetimibe 10mg  daily and continuing rosuvastatin 40mg  daily. Would recheck lipids and LFTs in 3 months.   The patient has been notified of the result and verbalized understanding.  All questions (if any) were answered. Antonieta Iba, RN 06/03/2020 2:58 PM  Patient will start zetia 10 mg daily and repeat labs in 3 months.

## 2020-09-07 ENCOUNTER — Other Ambulatory Visit: Payer: Self-pay

## 2020-09-07 ENCOUNTER — Other Ambulatory Visit: Payer: Medicare Other

## 2020-09-07 DIAGNOSIS — E785 Hyperlipidemia, unspecified: Secondary | ICD-10-CM | POA: Diagnosis not present

## 2020-09-07 LAB — ALT: ALT: 29 [IU]/L (ref 0–32)

## 2020-09-07 LAB — LIPID PANEL
Chol/HDL Ratio: 2.1 ratio (ref 0.0–4.4)
Cholesterol, Total: 133 mg/dL (ref 100–199)
HDL: 62 mg/dL
LDL Chol Calc (NIH): 54 mg/dL (ref 0–99)
Triglycerides: 88 mg/dL (ref 0–149)
VLDL Cholesterol Cal: 17 mg/dL (ref 5–40)

## 2020-09-16 DIAGNOSIS — K219 Gastro-esophageal reflux disease without esophagitis: Secondary | ICD-10-CM | POA: Diagnosis not present

## 2020-09-16 DIAGNOSIS — E7800 Pure hypercholesterolemia, unspecified: Secondary | ICD-10-CM | POA: Diagnosis not present

## 2020-09-16 DIAGNOSIS — J45909 Unspecified asthma, uncomplicated: Secondary | ICD-10-CM | POA: Diagnosis not present

## 2020-09-16 DIAGNOSIS — I10 Essential (primary) hypertension: Secondary | ICD-10-CM | POA: Diagnosis not present

## 2020-09-16 DIAGNOSIS — I5030 Unspecified diastolic (congestive) heart failure: Secondary | ICD-10-CM | POA: Diagnosis not present

## 2020-09-16 DIAGNOSIS — I25119 Atherosclerotic heart disease of native coronary artery with unspecified angina pectoris: Secondary | ICD-10-CM | POA: Diagnosis not present

## 2020-09-16 DIAGNOSIS — G2581 Restless legs syndrome: Secondary | ICD-10-CM | POA: Diagnosis not present

## 2020-09-16 DIAGNOSIS — R7309 Other abnormal glucose: Secondary | ICD-10-CM | POA: Diagnosis not present

## 2020-09-16 DIAGNOSIS — I209 Angina pectoris, unspecified: Secondary | ICD-10-CM | POA: Diagnosis not present

## 2020-09-16 DIAGNOSIS — M899 Disorder of bone, unspecified: Secondary | ICD-10-CM | POA: Diagnosis not present

## 2020-09-16 DIAGNOSIS — R7303 Prediabetes: Secondary | ICD-10-CM | POA: Diagnosis not present

## 2020-09-30 DIAGNOSIS — Z961 Presence of intraocular lens: Secondary | ICD-10-CM | POA: Diagnosis not present

## 2020-10-07 ENCOUNTER — Other Ambulatory Visit: Payer: Self-pay | Admitting: Cardiology

## 2020-10-29 ENCOUNTER — Other Ambulatory Visit: Payer: Self-pay | Admitting: Physician Assistant

## 2021-01-03 ENCOUNTER — Other Ambulatory Visit: Payer: Self-pay | Admitting: Cardiology

## 2021-01-13 DIAGNOSIS — Z23 Encounter for immunization: Secondary | ICD-10-CM | POA: Diagnosis not present

## 2021-01-18 DIAGNOSIS — Z23 Encounter for immunization: Secondary | ICD-10-CM | POA: Diagnosis not present

## 2021-01-26 ENCOUNTER — Other Ambulatory Visit: Payer: Self-pay | Admitting: Cardiology

## 2021-01-31 ENCOUNTER — Ambulatory Visit (INDEPENDENT_AMBULATORY_CARE_PROVIDER_SITE_OTHER): Payer: Medicare Other | Admitting: Cardiology

## 2021-01-31 ENCOUNTER — Encounter: Payer: Self-pay | Admitting: Cardiology

## 2021-01-31 ENCOUNTER — Other Ambulatory Visit: Payer: Self-pay

## 2021-01-31 VITALS — BP 160/94 | HR 55 | Ht 62.0 in | Wt 175.4 lb

## 2021-01-31 DIAGNOSIS — E7800 Pure hypercholesterolemia, unspecified: Secondary | ICD-10-CM | POA: Diagnosis not present

## 2021-01-31 DIAGNOSIS — I25118 Atherosclerotic heart disease of native coronary artery with other forms of angina pectoris: Secondary | ICD-10-CM | POA: Diagnosis not present

## 2021-01-31 DIAGNOSIS — I10 Essential (primary) hypertension: Secondary | ICD-10-CM

## 2021-01-31 DIAGNOSIS — I77810 Thoracic aortic ectasia: Secondary | ICD-10-CM | POA: Diagnosis not present

## 2021-01-31 MED ORDER — HYDROCHLOROTHIAZIDE 12.5 MG PO CAPS: 12.5000 mg | ORAL_CAPSULE | Freq: Every day | ORAL | 3 refills | Status: DC

## 2021-01-31 MED ORDER — RANOLAZINE ER 500 MG PO TB12: 500.0000 mg | ORAL_TABLET | Freq: Two times a day (BID) | ORAL | 3 refills | Status: DC

## 2021-01-31 MED ORDER — ISOSORBIDE MONONITRATE ER 60 MG PO TB24: 60.0000 mg | ORAL_TABLET | Freq: Every day | ORAL | 3 refills | Status: DC

## 2021-01-31 MED ORDER — METOPROLOL TARTRATE 25 MG PO TABS: 25.0000 mg | ORAL_TABLET | Freq: Two times a day (BID) | ORAL | 3 refills | Status: DC

## 2021-01-31 MED ORDER — EZETIMIBE 10 MG PO TABS: 10.0000 mg | ORAL_TABLET | Freq: Every day | ORAL | 3 refills | Status: DC

## 2021-01-31 NOTE — Addendum Note (Signed)
Addended by: Antonieta Iba on: 01/31/2021 09:15 AM   Modules accepted: Orders

## 2021-01-31 NOTE — Progress Notes (Addendum)
Cardiology Office Note    Date:  01/31/2021   ID:  Connie Weber, DOB Feb 25, 1941, MRN [id number]  PCP:  Gaynelle Arabian, MD  Cardiologist: Fransico Him, MD EPS: None  Chief Complaint  Patient presents with   Coronary Artery Disease   Hypertension   Hyperlipidemia     History of Present Illness:  Connie Weber is a 80 y.o. female with a hx of HTN, HLD, asthma, GERD and CKD stage III, ascending aortic aneurysm measuring 40 mm and CAD with Coronary CTA showing a coronary calcium score of 298 with  moderate atherosclerosis of the ostial RCA, proximal to mid LAD and proximal LCx with normal coronary origin with right dominance.  FFR analysis demonstrated possibly flow-limiting lesion in the diagonal branch and mid to distal LAD with recommendations for aggressive medical management and if continued chest discomfort then consider cardiac catheterization.    Patient continued to have chest pain dyspnea on exertion despite increase in long-acting nitrates.    Patient underwent left heart cath 08/22/2019 75-80% ostial first diagonal, 50% proximal to mid LAD with FFR of the LAD 0.93 recommended medical therapy for diagonal disease because PCI of the diagonal would involve the LAD and possible lead to stenting.  Recommended intensify treatment for diastolic heart failure and angina.   [name], PA saw the patient 09/16/2019 when she was having chest pain and dyspnea on exertion with little activity. She increased HCTZ to 25 mg daily because of the elevated LVEDP and increased Imdur to 60 mg daily.     F/U 10/08/19 patient was doing a little better but still having some chest pain just not as frequent. BP was running low so telmisartan was decreased to 20 mg daily. Patient called in 10/21/19 with low BP and some dizziness and telmisartan was stopped and added ranexa.   She is here today for followup and is doing well.  She recently lost her son and has had some chest pain.  The pain occurs with  exertion and she will get SOB with it.  The pain is very short lived and she has not had to take any NTG for it.  She says that she thinks it is about the same in frequency that is has been in the past and no change from her typical anginal pain.  She has chronic DOE which she says is stable.  She denies any PND, orthopnea, LE edema, dizziness, palpitations or syncope. He is compliant with his meds and is tolerating meds with no SE.      Past Medical History:  Diagnosis Date   Arthritis    Ascending aorta dilatation (HCC)    49mm by echo 2020   Asthma    Cancer ([place])    basal cell    CKD (chronic kidney disease) stage 3, GFR 30-59 ml/min (HCC)    Complication of anesthesia    difficulty waking up, feels that it was due to not having an inhaler used prior to surgery   GERD (gastroesophageal reflux disease)    GERD without esophagitis    H/O hiatal hernia    Headache(784.0)    migraines as a young adult   History of basal cell carcinoma of skin    Hypercholesteremia    Hypertension    Osteopenia    Prediabetes     Past Surgical History:  Procedure Laterality Date   ABDOMINAL HYSTERECTOMY     APPENDECTOMY     CHOLECYSTECTOMY     COLONOSCOPY  EYE SURGERY     cataract surgery x 2 and implants   INTRAVASCULAR PRESSURE WIRE/FFR STUDY N/A 08/22/2019   Procedure: INTRAVASCULAR PRESSURE WIRE/FFR STUDY;  Surgeon: Belva Crome, MD;  Location: [facility];  Service: Cardiovascular;  Laterality: N/A;   LEFT HEART CATH AND CORONARY ANGIOGRAPHY N/A 08/22/2019   Procedure: LEFT HEART CATH AND CORONARY ANGIOGRAPHY;  Surgeon: Belva Crome, MD;  Location: [facility];  Service: Cardiovascular;  Laterality: N/A;   ORIF ANKLE FRACTURE Right 12/25/2012   Procedure: OPEN REDUCTION INTERNAL FIXATION (ORIF) RIGHT ANKLE FRACTURE;  Surgeon: Marianna Payment, MD;  Location: [facility];  Service: Orthopedics;  Laterality: Right;   rotator cuff surgery Left 2013    Current  Medications: Current Meds  Medication Sig   albuterol (VENTOLIN HFA) 108 (90 Base) MCG/ACT inhaler Inhale 1-2 puffs into the lungs every 4 (four) hours as needed for wheezing or shortness of breath.   aspirin 81 MG EC tablet Take 1 tablet (81 mg total) by mouth daily. Swallow whole.   BIOTIN 5000 PO Take 5,000 mcg by mouth in the morning and at bedtime.    Calcium Carbonate-Vitamin D 600-400 MG-UNIT tablet Take 1 tablet by mouth 2 (two) times daily.   ezetimibe (ZETIA) 10 MG tablet Take 1 tablet (10 mg total) by mouth daily.   Glucosamine-Chondroitin (GLUCOSAMINE CHONDR COMPLEX PO) Take 1 tablet by mouth in the morning and at bedtime.    hydrochlorothiazide (MICROZIDE) 12.5 MG capsule Take 12.5 mg by mouth daily.   ibuprofen (ADVIL,MOTRIN) 200 MG tablet Take 200-400 mg by mouth every 8 (eight) hours as needed (for pain.).    isosorbide mononitrate (IMDUR) 60 MG 24 hr tablet TAKE 1 TABLET BY MOUTH EVERY DAY   metoprolol tartrate (LOPRESSOR) 25 MG tablet TAKE 1 TABLET BY MOUTH TWICE A DAY   omeprazole (PRILOSEC) 40 MG capsule Take 40 mg by mouth daily.   Polyethyl Glycol-Propyl Glycol (SYSTANE) 0.4-0.3 % SOLN Place 1 drop into both eyes 3 (three) times daily as needed (dry/irritated eyes.).   rOPINIRole (REQUIP) 0.5 MG tablet Take 1 tablet by mouth. 1-3 hours before bedtime   vitamin C (ASCORBIC ACID) 500 MG tablet Take 500 mg by mouth 2 (two) times daily.   Wheat Dextrin (EQ FIBER POWDER PO) Take 15.12 g by mouth daily. 2 heaping teaspoons   zinc gluconate 50 MG tablet Take 1 tablet (50 mg total) by mouth daily.   [DISCONTINUED] ranolazine (RANEXA) 500 MG 12 hr tablet Take 1 tablet (500 mg total) by mouth 2 (two) times daily. Please keep upcoming appt for future refills Thank you 2nd attempt     Allergies:   Ace inhibitors, Actonel [risedronate sodium], Atorvastatin, Fosamax [alendronate sodium], Latex, and Prednisone   Social History   Socioeconomic History   Marital status: Married     Spouse name: Not on file   Number of children: Not on file   Years of education: Not on file   Highest education level: Not on file  Occupational History   Not on file  Tobacco Use   Smoking status: Former   Smokeless tobacco: Never  Substance and Sexual Activity   Alcohol use: No   Drug use: No   Sexual activity: Not on file  Other Topics Concern   Not on file  Social History Narrative   Not on file   Social Determinants of Health   Financial Resource Strain: Not on file  Food Insecurity: Not on file  Transportation  Needs: Not on file  Physical Activity: Not on file  Stress: Not on file  Social Connections: Not on file     Family History:  The patient's family history includes Breast cancer in her sister, sister, and sister.   ROS:   Please see the history of present illness.    ROS All other systems reviewed and are negative.   PHYSICAL EXAM:   VS:  BP (!) 160/94   Pulse (!) 55   Ht 5\' 2"  (1.575 m)   Wt 175 lb 6.4 oz (79.6 kg)   SpO2 99%   BMI 32.08 kg/m   Physical Exam  GEN: Well nourished, well developed in no acute distress HEENT: Normal NECK: No JVD; No carotid bruits LYMPHATICS: No lymphadenopathy CARDIAC:RRR, no  rubs, gallops.  2/6 SM at RUSB RESPIRATORY:  Clear to auscultation without rales, wheezing or rhonchi  ABDOMEN: Soft, non-tender, non-distended MUSCULOSKELETAL:  No edema; No deformity  SKIN: Warm and dry NEUROLOGIC:  Alert and oriented x 3 PSYCHIATRIC:  Normal affect    Wt Readings from Last 3 Encounters:  01/31/21 175 lb 6.4 oz (79.6 kg)  12/24/19 166 lb 3.2 oz (75.4 kg)  11/11/19 173 lb (78.5 kg)      Studies/Labs Reviewed:   EKG:  EKG is ordered today and showed sinus bradycardia at 55bpm  Recent Labs: 09/07/2020: ALT 29   Lipid Panel    Component Value Date/Time   CHOL 133 09/07/2020 0904   TRIG 88 09/07/2020 0904   HDL 62 09/07/2020 0904   CHOLHDL 2.1 09/07/2020 0904   LDLCALC 54 09/07/2020 0904    Additional  studies/ records that were reviewed today include:  Cardiac catheterization 4/30/2021Left heart cath, coronary angiography, and hemodynamic recordings via right radial using real-time vascular ultrasound for arterial access Significant pain in the forearm and brachial related to catheter size relative to arteries. Left main is widely patent. Segmental 50% proximal to mid LAD narrowing.  The first diagonal arises in the middle of the segmental LAD stenosis and contains 75 to 80% stenosis ostially and into the proximal segment. Circumflex is large with no significant obstructive lesion. Right coronary is dominant with no significant obstructive lesion. RFR: LAD 0.93.  Significant is less than 0.89.  Did not assess the diagonal as it is likely hemodynamically significant.  Recommend aggressive medical therapy for diagonal disease.  I shot away from PCI all the diagonal because PCI would involve the LAD and possibility lead stenting.  Recommendation: Intensify therapy for diastolic heart failure and angina.   Echo 03/2019  IMPRESSIONS     1. Left ventricular ejection fraction, by visual estimation, is 65 to  70%. The left ventricle has hyperdynamic function. There is mildly  increased left ventricular hypertrophy.   2. Left ventricular diastolic parameters are consistent with Grade I  diastolic dysfunction (impaired relaxation).   3. The left ventricle has no regional wall motion abnormalities.   4. Global right ventricle has normal systolic function.The right  ventricular size is normal. No increase in right ventricular wall  thickness.   5. Left atrial size was normal.   6. Right atrial size was normal.   7. The mitral valve is normal in structure. Trivial mitral valve  regurgitation. No evidence of mitral stenosis.   8. The tricuspid valve is normal in structure. Tricuspid valve  regurgitation is not demonstrated.   9. The aortic valve is normal in structure. Aortic valve regurgitation is   not visualized. Mild aortic valve sclerosis without  stenosis.  10. The pulmonic valve was normal in structure. Pulmonic valve  regurgitation is not visualized.  11. Aneurysm of the ascending aorta, measuring 40 mm.  12. The inferior vena cava is normal in size with greater than 50%  respiratory variability, suggesting right atrial pressure of 3 mmHg.   FINDINGS   Left Ventricle: Left ventricular ejection fraction, by visual estimation,  is 65 to 70%. The left ventricle has hyperdynamic function. The left  ventricle has no regional wall motion abnormalities. There is mildly  increased left ventricular hypertrophy.  Left ventricular diastolic parameters are consistent with Grade I  diastolic dysfunction (impaired relaxation). Normal left atrial pressure.   Right Ventricle: The right ventricular size is normal. No increase in  right ventricular wall thickness. Global RV systolic function is has  normal systolic function.   Left Atrium: Left atrial size was normal in size.   Right Atrium: Right atrial size was normal in size   Pericardium: There is no evidence of pericardial effusion.   Mitral Valve: The mitral valve is normal in structure. Trivial mitral  valve regurgitation. No evidence of mitral valve stenosis by observation.   Tricuspid Valve: The tricuspid valve is normal in structure. Tricuspid  valve regurgitation is not demonstrated.   Aortic Valve: The aortic valve is normal in structure. Aortic valve  regurgitation is not visualized. Mild aortic valve sclerosis is present,  with no evidence of aortic valve stenosis.   Pulmonic Valve: The pulmonic valve was normal in structure. Pulmonic valve  regurgitation is not visualized. Pulmonic regurgitation is not visualized.   Aorta: The aortic root, ascending aorta and aortic arch are all  structurally normal, with no evidence of dilitation or obstruction. There  is an aneurysm involving the ascending aorta. The aneurysm  measures 40 mm.   Venous: The inferior vena cava is normal in size with greater than 50%  respiratory variability, suggesting right atrial pressure of 3 mmHg.   IAS/Shunts: No atrial level shunt detected by color flow Doppler. There is  no evidence of a patent foramen ovale. No ventricular septal defect is  seen or detected. There is no evidence of an atrial septal defect.      ASSESSMENT:    1. Coronary artery disease of native artery of native heart with stable angina pectoris ([place])   2. Essential hypertension   3. Pure hypercholesterolemia   4. Ascending aorta dilatation (HCC)      PLAN:  In order of problems listed above:  1.  ASCAD -coronary CTA calcium score 298 with moderate atherosclerosis of the ostial RCA proximal to mid LAD and proximal circumflex, FFR analysis demonstrated possible flow-limiting lesion in the diagonal branch in the mid to distal LAD.   -Cardiac cath 30/2150% proximal to mid LAD and 75% to 80% diagonal 1 >> medical therapy recommended.  -HCTZ increased for LVEDP and Imdur increased for ongoing CP -Ranexa added 10/21/19 with improvement in CP -she has chronic stable angina which has not changed any recently -continue prescription drug management with ASA 81mg  daily, statin, Imdur 60mg  daily, Lopressor 25mg  BID and Ranexa 500mg  BID>refilled   2.  Essential hypertension  -BP is elevated this am but has not taken her meds and she has known white coat HTN -she brought her BP readings from home which are very well controlled ranging from 104-121/66-96mmHg -Continue prescription drug management with Lopressor 25mg  BID and HCTZ 12.5mg  daily>refilled -I have personally reviewed and interpreted outside labs performed by patient's PCP which showed SCr  1.04 and K+ 4.8 in May 2022    3.  Hyperlipidemia  -LDL goal < 70 -I have personally reviewed and interpreted outside Labs performed by patient's PCP which showed LDL 54, HDL 62, TAG 88 and ALT 29 in May 2022   -continue prescription drug management with Crestor 40mg   and Zetia 10mg   daily>refilled  4.  Dilated ascending aorta  -noted on echo 03/2019 measuring 27mm -repeat echo 03/2020 42mm -continue yearly echo to follow -BP is well controlled at home   Medication Adjustments/Labs and Tests Ordered: Current medicines are reviewed at length with the patient today.  Concerns regarding medicines are outlined above.  Medication changes, Labs and Tests ordered today are listed in the Patient Instructions below. There are no Patient Instructions on file for this visit.   Signed, Fransico Him, MD  01/31/2021 9:03 AM    [facility] [place], [place],   [address] Phone: [phone]; Fax: [phone]

## 2021-01-31 NOTE — Patient Instructions (Signed)
Medication Instructions:  Your physician recommends that you continue on your current medications as directed. Please refer to the Current Medication list given to you today.  *If you need a refill on your cardiac medications before your next appointment, please call your pharmacy*   Testing/Procedures: Your physician has requested that you have an echocardiogram in December 2022. Echocardiography is a painless test that uses sound waves to create images of your heart. It provides your doctor with information about the size and shape of your heart and how well your heart's chambers and valves are working. This procedure takes approximately one hour. There are no restrictions for this procedure.  Follow-Up: At [facility], you and your health needs are our priority.  As part of our continuing mission to provide you with exceptional heart care, we have created designated Provider Care Teams.  These Care Teams include your primary Cardiologist (physician) and Advanced Practice Providers (APPs -  Physician Assistants and Nurse Practitioners) who all work together to provide you with the care you need, when you need it.   Your next appointment:   1 year(s)  The format for your next appointment:   In Person  Provider:   You may see Fransico Him, MD or one of the following Advanced Practice Providers on your designated Care Team:   Melina Copa, PA-C Ermalinda Barrios, PA-C

## 2021-03-29 ENCOUNTER — Other Ambulatory Visit: Payer: Self-pay

## 2021-03-29 ENCOUNTER — Ambulatory Visit (HOSPITAL_COMMUNITY): Payer: Medicare Other | Attending: Cardiology

## 2021-03-29 DIAGNOSIS — I77810 Thoracic aortic ectasia: Secondary | ICD-10-CM

## 2021-03-29 DIAGNOSIS — I10 Essential (primary) hypertension: Secondary | ICD-10-CM | POA: Diagnosis not present

## 2021-03-29 DIAGNOSIS — I25118 Atherosclerotic heart disease of native coronary artery with other forms of angina pectoris: Secondary | ICD-10-CM

## 2021-03-29 DIAGNOSIS — E7800 Pure hypercholesterolemia, unspecified: Secondary | ICD-10-CM | POA: Diagnosis not present

## 2021-03-29 LAB — ECHOCARDIOGRAM COMPLETE
Area-P 1/2: 2.48 cm2
S' Lateral: 2.8 cm

## 2021-04-05 DIAGNOSIS — G2581 Restless legs syndrome: Secondary | ICD-10-CM | POA: Diagnosis not present

## 2021-04-05 DIAGNOSIS — I25119 Atherosclerotic heart disease of native coronary artery with unspecified angina pectoris: Secondary | ICD-10-CM | POA: Diagnosis not present

## 2021-04-05 DIAGNOSIS — Z0000 Encounter for general adult medical examination without abnormal findings: Secondary | ICD-10-CM | POA: Diagnosis not present

## 2021-04-05 DIAGNOSIS — I10 Essential (primary) hypertension: Secondary | ICD-10-CM | POA: Diagnosis not present

## 2021-04-05 DIAGNOSIS — E7800 Pure hypercholesterolemia, unspecified: Secondary | ICD-10-CM | POA: Diagnosis not present

## 2021-04-05 DIAGNOSIS — I209 Angina pectoris, unspecified: Secondary | ICD-10-CM | POA: Diagnosis not present

## 2021-04-05 DIAGNOSIS — I5030 Unspecified diastolic (congestive) heart failure: Secondary | ICD-10-CM | POA: Diagnosis not present

## 2021-04-05 DIAGNOSIS — K219 Gastro-esophageal reflux disease without esophagitis: Secondary | ICD-10-CM | POA: Diagnosis not present

## 2021-04-05 DIAGNOSIS — R7309 Other abnormal glucose: Secondary | ICD-10-CM | POA: Diagnosis not present

## 2021-04-05 DIAGNOSIS — J45909 Unspecified asthma, uncomplicated: Secondary | ICD-10-CM | POA: Diagnosis not present

## 2021-04-05 DIAGNOSIS — Z1389 Encounter for screening for other disorder: Secondary | ICD-10-CM | POA: Diagnosis not present

## 2021-04-30 ENCOUNTER — Other Ambulatory Visit: Payer: Self-pay | Admitting: Cardiology

## 2021-05-03 ENCOUNTER — Other Ambulatory Visit: Payer: Self-pay | Admitting: Family Medicine

## 2021-05-03 DIAGNOSIS — Z1231 Encounter for screening mammogram for malignant neoplasm of breast: Secondary | ICD-10-CM

## 2021-05-25 ENCOUNTER — Ambulatory Visit
Admission: RE | Admit: 2021-05-25 | Discharge: 2021-05-25 | Disposition: A | Discharge status: Home / Self Care | Payer: Medicare Other | Source: Ambulatory Visit | Attending: Family Medicine | Admitting: Family Medicine

## 2021-05-25 DIAGNOSIS — Z1231 Encounter for screening mammogram for malignant neoplasm of breast: Secondary | ICD-10-CM

## 2021-05-26 ENCOUNTER — Other Ambulatory Visit: Payer: Self-pay | Admitting: Family Medicine

## 2021-05-26 DIAGNOSIS — R928 Other abnormal and inconclusive findings on diagnostic imaging of breast: Secondary | ICD-10-CM

## 2021-06-11 ENCOUNTER — Ambulatory Visit: Payer: Medicare Other

## 2021-06-11 ENCOUNTER — Ambulatory Visit
Admission: RE | Admit: 2021-06-11 | Discharge: 2021-06-11 | Disposition: A | Discharge status: Home / Self Care | Payer: Medicare Other | Source: Ambulatory Visit | Attending: Family Medicine | Admitting: Family Medicine

## 2021-06-11 DIAGNOSIS — R928 Other abnormal and inconclusive findings on diagnostic imaging of breast: Secondary | ICD-10-CM

## 2021-06-11 DIAGNOSIS — R922 Inconclusive mammogram: Secondary | ICD-10-CM | POA: Diagnosis not present

## 2021-08-06 ENCOUNTER — Other Ambulatory Visit: Payer: Self-pay | Admitting: Cardiology

## 2021-09-27 DIAGNOSIS — Z961 Presence of intraocular lens: Secondary | ICD-10-CM | POA: Diagnosis not present

## 2021-10-04 DIAGNOSIS — K219 Gastro-esophageal reflux disease without esophagitis: Secondary | ICD-10-CM | POA: Diagnosis not present

## 2021-10-04 DIAGNOSIS — I209 Angina pectoris, unspecified: Secondary | ICD-10-CM | POA: Diagnosis not present

## 2021-10-04 DIAGNOSIS — R7303 Prediabetes: Secondary | ICD-10-CM | POA: Diagnosis not present

## 2021-10-04 DIAGNOSIS — G2581 Restless legs syndrome: Secondary | ICD-10-CM | POA: Diagnosis not present

## 2021-10-04 DIAGNOSIS — J45909 Unspecified asthma, uncomplicated: Secondary | ICD-10-CM | POA: Diagnosis not present

## 2021-10-04 DIAGNOSIS — I10 Essential (primary) hypertension: Secondary | ICD-10-CM | POA: Diagnosis not present

## 2021-10-04 DIAGNOSIS — E7800 Pure hypercholesterolemia, unspecified: Secondary | ICD-10-CM | POA: Diagnosis not present

## 2021-10-04 DIAGNOSIS — I5030 Unspecified diastolic (congestive) heart failure: Secondary | ICD-10-CM | POA: Diagnosis not present

## 2021-10-04 DIAGNOSIS — I25119 Atherosclerotic heart disease of native coronary artery with unspecified angina pectoris: Secondary | ICD-10-CM | POA: Diagnosis not present

## 2021-11-18 ENCOUNTER — Telehealth: Payer: Self-pay | Admitting: Cardiology

## 2021-11-18 MED ORDER — ISOSORBIDE MONONITRATE ER 60 MG PO TB24: 90.0000 mg | ORAL_TABLET | Freq: Every day | ORAL | 3 refills | Status: DC

## 2021-11-18 NOTE — Telephone Encounter (Signed)
Pt called requesting to increase her IMDUR 60 Mg.   Pt stated her husband recently passed away November 25, 2021;  She has had intermittent periods of angina, that does NOT last more than a few minutes at a time.  This also occurred during the funeral.   When I called the patient back, she was asymptomatic, and shared her vital signs taken at home with me this morning 7/28:  BP 103/69, P 63, Temp 98.4, and O2 95%    Pt states she has NOT taken more IMDUR than the 60 Mg ordered by Dr. Radford Pax.    Will reach out to Dr. Radford Pax for guidance per patient request.

## 2021-11-18 NOTE — Telephone Encounter (Signed)
Pt c/o medication issue:  1. Name of Medication: isosorbide mononitrate (IMDUR) 60 MG 24 hr tablet  2. How are you currently taking this medication (dosage and times per day)? As written  3. Are you having a reaction (difficulty breathing--STAT)? no  4. What is your medication issue? Pt is calling because she wants to increase the dosage on this medication. Please advise.

## 2021-11-18 NOTE — Telephone Encounter (Signed)
Okay to increase Imdur to 90 mg daily but if she continues to have chest pain she needs to make an appointment to be seen in the office  Per Dr. Fransico Him 11/18/2021   Pt called back and made aware of the message above.  Pt advised and understood if she continues to have angina, chest pain, persistent symptoms, or change in vital signs, Pt should call [facility] and schedule a provider visit.    Pt understood Dr. Theodosia Blender instructions, and Med list updated in Epic to reflect the increase of IMDUR to 90 Mg.

## 2021-11-22 ENCOUNTER — Other Ambulatory Visit: Payer: Self-pay | Admitting: Cardiology

## 2022-01-17 ENCOUNTER — Other Ambulatory Visit: Payer: Self-pay | Admitting: Cardiology

## 2022-01-21 DIAGNOSIS — Z23 Encounter for immunization: Secondary | ICD-10-CM | POA: Diagnosis not present

## 2022-02-13 ENCOUNTER — Other Ambulatory Visit: Payer: Self-pay | Admitting: Cardiology

## 2022-03-03 ENCOUNTER — Ambulatory Visit: Payer: Medicare Other | Attending: Cardiology | Admitting: Cardiology

## 2022-03-03 ENCOUNTER — Encounter: Payer: Self-pay | Admitting: Cardiology

## 2022-03-03 VITALS — BP 124/74 | HR 59 | Ht 62.0 in | Wt 172.0 lb

## 2022-03-03 DIAGNOSIS — R002 Palpitations: Secondary | ICD-10-CM

## 2022-03-03 DIAGNOSIS — I25118 Atherosclerotic heart disease of native coronary artery with other forms of angina pectoris: Secondary | ICD-10-CM

## 2022-03-03 DIAGNOSIS — E7800 Pure hypercholesterolemia, unspecified: Secondary | ICD-10-CM

## 2022-03-03 DIAGNOSIS — I10 Essential (primary) hypertension: Secondary | ICD-10-CM

## 2022-03-03 DIAGNOSIS — I77810 Thoracic aortic ectasia: Secondary | ICD-10-CM | POA: Diagnosis not present

## 2022-03-03 NOTE — Patient Instructions (Addendum)
Take your blood pressure twice daily for the next week and call us with a list of your readings.   Medication Instructions:  Your physician has recommended you make the following change in your medication:  1) STOP taking hydrochlorothiazide  *If you need a refill on your cardiac medications before your next appointment, please call your pharmacy*  Testing/Procedures: Your physician has recommended that you wear an event monitor. Event monitors are medical devices that record the heart's electrical activity. Doctors most often [place] these monitors to diagnose arrhythmias. Arrhythmias are problems with the speed or rhythm of the heartbeat. The monitor is a small, portable device. You can wear one while you do your normal daily activities. This is usually used to diagnose what is causing palpitations/syncope (passing out).  Your physician has requested that you have an echocardiogram. Echocardiography is a painless test that uses sound waves to create images of your heart. It provides your doctor with information about the size and shape of your heart and how well your heart's chambers and valves are working. This procedure takes approximately one hour. There are no restrictions for this procedure. Please do NOT wear cologne, perfume, aftershave, or lotions (deodorant is allowed). Please arrive 15 minutes prior to your appointment time.   Follow-Up: At [facility], you and your health needs are our priority.  As part of our continuing mission to provide you with exceptional heart care, we have created designated Provider Care Teams.  These Care Teams include your primary Cardiologist (physician) and Advanced Practice Providers (APPs -  Physician Assistants and Nurse Practitioners) who all work together to provide you with the care you need, when you need it.  Your next appointment:   1 year(s)  The format for your next appointment:   In Person  Provider:   Fransico Him, MD     Other  Instructions [employer] Cardiac Event Monitor Instructions Your physician has requested you wear your cardiac event monitor for 30 days. [employer] may call or text to confirm a shipping address. The monitor will be sent to a land address via UPS. [employer] will not ship a monitor to a PO BOX. It typically takes 3-5 days to receive your monitor after it has been enrolled. [employer] will assist with USPS tracking if your package is delayed. The telephone number for [employer] is [phone]. Once you have received your monitor, please review the enclosed instructions. Instruction tutorials can also be viewed under help and settings on the enclosed cell phone. Your monitor has already been registered assigning a specific monitor serial # to you.  Applying the monitor Remove cell phone from case and turn it on. The cell phone works as Dealer and needs to be within [employer] of you at all times. The cell phone will need to be charged on a daily basis. We recommend you plug the cell phone into the enclosed charger at your bedside table every night.  Monitor batteries: You will receive two monitor batteries labelled #1 and #2. These are your recorders. Plug battery #2 onto the second connection on the enclosed charger. Keep one battery on the charger at all times. This will keep the monitor battery deactivated. It will also keep it fully charged for when you need to switch your monitor batteries. A small light will be blinking on the battery emblem when it is charging. The light on the battery emblem will remain on when the battery is fully charged.  Open package of a Monitor strip. Insert  battery #1 into black hood on strip and gently squeeze monitor battery onto connection as indicated in instruction booklet. Set aside while preparing skin.  Choose location for your strip, vertical or horizontal, as indicated in the instruction booklet. Shave to remove all hair from location. There  cannot be any lotions, oils, powders, or colognes on skin where monitor is to be applied. Wipe skin clean with enclosed Saline wipe. Dry skin completely.  Peel paper labeled #1 off the back of the Monitor strip exposing the adhesive. Place the monitor on the chest in the vertical or horizontal position shown in the instruction booklet. One arrow on the monitor strip must be pointing upward. Carefully remove paper labeled #2, attaching remainder of strip to your skin. Try not to create any folds or wrinkles in the strip as you apply it.  Firmly press and release the circle in the center of the monitor battery. You will hear a small beep. This is turning the monitor battery on. The heart emblem on the monitor battery will light up every 5 seconds if the monitor battery in turned on and connected to the patient securely. Do not push and hold the circle down as this turns the monitor battery off. The cell phone will locate the monitor battery. A screen will appear on the cell phone checking the connection of your monitor strip. This may read poor connection initially but change to good connection within the next minute. Once your monitor accepts the connection you will hear a series of 3 beeps followed by a climbing crescendo of beeps. A screen will appear on the cell phone showing the two monitor strip placement options. Touch the picture that demonstrates where you applied the monitor strip.  Your monitor strip and battery are waterproof. You are able to shower, bathe, or swim with the monitor on. They just ask you do not submerge deeper than 3 feet underwater. We recommend removing the monitor if you are swimming in a lake, river, or ocean.  Your monitor battery will need to be switched to a fully charged monitor battery approximately once a week. The cell phone will alert you of an action which needs to be made.  On the cell phone, tap for details to reveal connection status, monitor battery  status, and cell phone battery status. The green dots indicates your monitor is in good status. A red dot indicates there is something that needs your attention.  To record a symptom, click the circle on the monitor battery. In 30-60 seconds a list of symptoms will appear on the cell phone. Select your symptom and tap save. Your monitor will record a sustained or significant arrhythmia regardless of you clicking the button. Some patients do not feel the heart rhythm irregularities. [employer] will notify us of any serious or critical events.  Refer to instruction booklet for instructions on switching batteries, changing strips, the Do not disturb or Pause features, or any additional questions.  Call [employer] at [phone], to confirm your monitor is transmitting and record your baseline. They will answer any questions you may have regarding the monitor instructions at that time.  Returning the monitor to [place] all equipment back into blue box. Peel off strip of paper to expose adhesive and close box securely. There is a prepaid UPS shipping label on this box. Drop in a UPS drop box, or at a UPS facility like [employer]. You may also contact [employer] to arrange UPS to pick up monitor package at your home.  Important Information About Sugar

## 2022-03-03 NOTE — Progress Notes (Signed)
Cardiology Office Note    Date:  03/03/2022   ID:  [name], DOB 1940/07/11, MRN [id number]  PCP:  Connie Arabian, MD  Cardiologist: Connie Him, MD EPS: None  Chief Complaint  Patient presents with   Follow-up   Coronary Artery Disease   Hypertension   Hyperlipidemia     History of Present Illness:  Connie Weber is a 81 y.o. female with a hx of HTN, HLD, asthma, GERD and CKD stage III, ascending aortic aneurysm measuring 40 mm and CAD with Coronary CTA showing a coronary calcium score of 298 with  moderate atherosclerosis of the ostial RCA, proximal to mid LAD and proximal LCx with normal coronary origin with right dominance.  FFR analysis demonstrated possibly flow-limiting lesion in the diagonal branch and mid to distal LAD with recommendations for aggressive medical management and if continued chest discomfort then consider cardiac catheterization.      Patient continued to have chest pain dyspnea on exertion despite increase in long-acting nitrates and  underwent left heart cath 08/22/2019 75-80% ostial first diagonal, 50% proximal to mid LAD with FFR of the LAD 0.93 recommended medical therapy for diagonal disease because PCI of the diagonal would involve the LAD and possible lead to stenting.  Recommended intensify treatment for diastolic heart failure and angina.   Connie Husk, PA saw the patient 09/16/2019 when she was having chest pain and dyspnea on exertion with little activity. She increased HCTZ to 25 mg daily because of the elevated LVEDP and increased Imdur to 60 mg daily.     F/U 10/08/19 patient was doing a little better but still having some chest pain just not as frequent. BP was running low so telmisartan was decreased to 20 mg daily. Patient called in 10/21/19 with low BP and some dizziness and telmisartan was stopped and added ranexa.   She is here today for followup and is doing well.  She has chronic stable angina but thinks it has actually improved over the  past year.  She called in and decreased her Imdur to '60mg'$  daily as her CP had significantly improved.  She denies any SOB, DOE, PND, orthopnea, LE edema, palpitations or syncope.  She occasionally has some mild lightheadedness. She is compliant with her meds and is tolerating meds with no SE.  She tells me that occasionally her BP cuff will read irregular heart rhythm.  Past Medical History:  Diagnosis Date   Arthritis    Ascending aorta dilatation (HCC)    96m by echo 2020   Asthma    Cancer ([place]    basal cell    CKD (chronic kidney disease) stage 3, GFR 30-59 ml/min (HCC)    Complication of anesthesia    difficulty waking up, feels that it was due to not having an inhaler used prior to surgery   GERD (gastroesophageal reflux disease)    GERD without esophagitis    H/O hiatal hernia    Headache(784.0)    migraines as a young adult   History of basal cell carcinoma of skin    Hypercholesteremia    Hypertension    Osteopenia    Prediabetes     Past Surgical History:  Procedure Laterality Date   ABDOMINAL HYSTERECTOMY     APPENDECTOMY     CHOLECYSTECTOMY     COLONOSCOPY     EYE SURGERY     cataract surgery x 2 and implants   INTRAVASCULAR PRESSURE WIRE/FFR STUDY N/A 08/22/2019   Procedure: INTRAVASCULAR  PRESSURE WIRE/FFR STUDY;  Surgeon: Connie Crome, MD;  Location: [facility];  Service: Cardiovascular;  Laterality: N/A;   LEFT HEART CATH AND CORONARY ANGIOGRAPHY N/A 08/22/2019   Procedure: LEFT HEART CATH AND CORONARY ANGIOGRAPHY;  Surgeon: Connie Crome, MD;  Location: [facility];  Service: Cardiovascular;  Laterality: N/A;   ORIF ANKLE FRACTURE Right 12/25/2012   Procedure: OPEN REDUCTION INTERNAL FIXATION (ORIF) RIGHT ANKLE FRACTURE;  Surgeon: Connie Payment, MD;  Location: [facility];  Service: Orthopedics;  Laterality: Right;   rotator cuff surgery Left 2013    Current Medications: Current Meds  Medication Sig   albuterol (VENTOLIN HFA) 108 (90 Base)  MCG/ACT inhaler Inhale 1-2 puffs into the lungs every 4 (four) hours as needed for wheezing or shortness of breath.   aspirin 81 MG EC tablet Take 1 tablet (81 mg total) by mouth daily. Swallow whole.   BIOTIN 5000 PO Take 5,000 mcg by mouth in the morning and at bedtime.    Calcium Carbonate-Vitamin D 600-400 MG-UNIT tablet Take 1 tablet by mouth 2 (two) times daily.   ezetimibe (ZETIA) 10 MG tablet TAKE 1 TABLET BY MOUTH EVERY DAY   Glucosamine-Chondroitin (GLUCOSAMINE CHONDR COMPLEX PO) Take 1 tablet by mouth in the morning and at bedtime.    hydrochlorothiazide (MICROZIDE) 12.5 MG capsule Take 1 capsule (12.5 mg total) by mouth daily.   ibuprofen (ADVIL,MOTRIN) 200 MG tablet Take 200-400 mg by mouth every 8 (eight) hours as needed (for pain.).    isosorbide mononitrate (IMDUR) 60 MG 24 hr tablet Take 60 mg by mouth daily.   metoprolol tartrate (LOPRESSOR) 25 MG tablet TAKE 1 TABLET BY MOUTH TWICE A DAY   Multiple Vitamin (MULTIVITAMIN WITH MINERALS) TABS tablet Take 1 tablet by mouth daily.   omeprazole (PRILOSEC) 40 MG capsule Take 40 mg by mouth daily.   Polyethyl Glycol-Propyl Glycol (SYSTANE) 0.4-0.3 % SOLN Place 1 drop into both eyes 3 (three) times daily as needed (dry/irritated eyes.).   pseudoephedrine (SUDAFED) 30 MG tablet Take 30 mg by mouth every 4 (four) hours as needed for congestion (congestion/allergies.).   ranolazine (RANEXA) 500 MG 12 hr tablet TAKE 1 TABLET BY MOUTH 2 TIMES DAILY. PLEASE KEEP UPCOMING APPT FOR FUTURE REFILLS   rOPINIRole (REQUIP) 0.5 MG tablet Take 1 tablet by mouth. 1-3 hours before bedtime   rosuvastatin (CRESTOR) 40 MG tablet TAKE 1 TABLET BY MOUTH EVERY DAY   vitamin C (ASCORBIC ACID) 500 MG tablet Take 500 mg by mouth 2 (two) times daily.   Wheat Dextrin (EQ FIBER POWDER PO) Take 15.12 g by mouth daily. 2 heaping teaspoons   zinc gluconate 50 MG tablet Take 1 tablet (50 mg total) by mouth daily.     Allergies:   Ace inhibitors, Actonel [risedronate  sodium], Atorvastatin, Fosamax [alendronate sodium], Latex, and Prednisone   Social History   Socioeconomic History   Marital status: Married    Spouse name: Not on file   Number of children: Not on file   Years of education: Not on file   Highest education level: Not on file  Occupational History   Not on file  Tobacco Use   Smoking status: Former   Smokeless tobacco: Never  Substance and Sexual Activity   Alcohol use: No   Drug use: No   Sexual activity: Not on file  Other Topics Concern   Not on file  Social History Narrative   Not on file   Social Determinants of  Health   Financial Resource Strain: Not on file  Food Insecurity: Not on file  Transportation Needs: Not on file  Physical Activity: Not on file  Stress: Not on file  Social Connections: Not on file     Family History:  The patient's family history includes Breast cancer in her sister, sister, and sister.   ROS:   Please see the history of present illness.    ROS All other systems reviewed and are negative.   PHYSICAL EXAM:   VS:  BP 124/74   Pulse (!) 59   Ht '5\' 2"'$  (1.575 m)   Wt 172 lb (78 kg)   SpO2 97%   BMI 31.46 kg/m   Physical Exam  GEN: Well nourished, well developed in no acute distress HEENT: Normal NECK: No JVD; No carotid bruits LYMPHATICS: No lymphadenopathy CARDIAC:RRR, no rubs, gallops.  1/6 SM at RUSB RESPIRATORY:  Clear to auscultation without rales, wheezing or rhonchi  ABDOMEN: Soft, non-tender, non-distended MUSCULOSKELETAL:  No edema; No deformity  SKIN: Warm and dry NEUROLOGIC:  Alert and oriented x 3 PSYCHIATRIC:  Normal affect  Wt Readings from Last 3 Encounters:  03/03/22 172 lb (78 kg)  01/31/21 175 lb 6.4 oz (79.6 kg)  12/24/19 166 lb 3.2 oz (75.4 kg)      Studies/Labs Reviewed:   EKG:  EKG is ordered today and showed NSR with nonspecific IVCD  Recent Labs: No results found for requested labs within last 365 days.   Lipid Panel    Component Value  Date/Time   CHOL 133 09/07/2020 0904   TRIG 88 09/07/2020 0904   HDL 62 09/07/2020 0904   CHOLHDL 2.1 09/07/2020 0904   LDLCALC 54 09/07/2020 0904    Additional studies/ records that were reviewed today include:  Cardiac catheterization 4/30/2021Left heart cath, coronary angiography, and hemodynamic recordings via right radial using real-time vascular ultrasound for arterial access Significant pain in the forearm and brachial related to catheter size relative to arteries. Left main is widely patent. Segmental 50% proximal to mid LAD narrowing.  The first diagonal arises in the middle of the segmental LAD stenosis and contains 75 to 80% stenosis ostially and into the proximal segment. Circumflex is large with no significant obstructive lesion. Right coronary is dominant with no significant obstructive lesion. RFR: LAD 0.93.  Significant is less than 0.89.  Did not assess the diagonal as it is likely hemodynamically significant.  Recommend aggressive medical therapy for diagonal disease.  I shot away from PCI all the diagonal because PCI would involve the LAD and possibility lead stenting.  Recommendation: Intensify therapy for diastolic heart failure and angina.   Echo 03/2019  IMPRESSIONS     1. Left ventricular ejection fraction, by visual estimation, is 65 to  70%. The left ventricle has hyperdynamic function. There is mildly  increased left ventricular hypertrophy.   2. Left ventricular diastolic parameters are consistent with Grade I  diastolic dysfunction (impaired relaxation).   3. The left ventricle has no regional wall motion abnormalities.   4. Global right ventricle has normal systolic function.The right  ventricular size is normal. No increase in right ventricular wall  thickness.   5. Left atrial size was normal.   6. Right atrial size was normal.   7. The mitral valve is normal in structure. Trivial mitral valve  regurgitation. No evidence of mitral stenosis.   8. The  tricuspid valve is normal in structure. Tricuspid valve  regurgitation is not demonstrated.   9. The  aortic valve is normal in structure. Aortic valve regurgitation is  not visualized. Mild aortic valve sclerosis without stenosis.  10. The pulmonic valve was normal in structure. Pulmonic valve  regurgitation is not visualized.  11. Aneurysm of the ascending aorta, measuring 40 mm.  12. The inferior vena cava is normal in size with greater than 50%  respiratory variability, suggesting right atrial pressure of 3 mmHg.   FINDINGS   Left Ventricle: Left ventricular ejection fraction, by visual estimation,  is 65 to 70%. The left ventricle has hyperdynamic function. The left  ventricle has no regional wall motion abnormalities. There is mildly  increased left ventricular hypertrophy.  Left ventricular diastolic parameters are consistent with Grade I  diastolic dysfunction (impaired relaxation). Normal left atrial pressure.   Right Ventricle: The right ventricular size is normal. No increase in  right ventricular wall thickness. Global RV systolic function is has  normal systolic function.   Left Atrium: Left atrial size was normal in size.   Right Atrium: Right atrial size was normal in size   Pericardium: There is no evidence of pericardial effusion.   Mitral Valve: The mitral valve is normal in structure. Trivial mitral  valve regurgitation. No evidence of mitral valve stenosis by observation.   Tricuspid Valve: The tricuspid valve is normal in structure. Tricuspid  valve regurgitation is not demonstrated.   Aortic Valve: The aortic valve is normal in structure. Aortic valve  regurgitation is not visualized. Mild aortic valve sclerosis is present,  with no evidence of aortic valve stenosis.   Pulmonic Valve: The pulmonic valve was normal in structure. Pulmonic valve  regurgitation is not visualized. Pulmonic regurgitation is not visualized.   Aorta: The aortic root, ascending  aorta and aortic arch are all  structurally normal, with no evidence of dilitation or obstruction. There  is an aneurysm involving the ascending aorta. The aneurysm measures 40 mm.   Venous: The inferior vena cava is normal in size with greater than 50%  respiratory variability, suggesting right atrial pressure of 3 mmHg.   IAS/Shunts: No atrial level shunt detected by color flow Doppler. There is  no evidence of a patent foramen ovale. No ventricular septal defect is  seen or detected. There is no evidence of an atrial septal defect.      ASSESSMENT:    1. Coronary artery disease of native artery of native heart with stable angina pectoris ([place])   2. Essential hypertension   3. Pure hypercholesterolemia   4. Ascending aorta dilatation (HCC)       PLAN:  In order of problems listed above:  1.  ASCAD -coronary CTA calcium score 298 with moderate atherosclerosis of the ostial RCA proximal to mid LAD and proximal circumflex, FFR analysis demonstrated possible flow-limiting lesion in the diagonal branch in the mid to distal LAD.   -Cardiac cath 30/2150% proximal to mid LAD and 75% to 80% diagonal 1 >> medical therapy recommended.  -HCTZ increased for LVEDP and Imdur increased for ongoing CP -Ranexa added 10/21/19 with improvement in CP -she has chronic stable angina and her angina appears stable since I saw her last and actually improved -Continue prescription drug with aspirin 81 mg daily, Imdur 60 mg daily, Lopressor 25 mg twice daily, Ranexa 500 mg twice daily and high-dose statin therapy with as needed refills  2.  Essential hypertension  -BP is controlled on exam today but she does have a history of whitecoat hypertension >>she brought in her BP readings today and actually  has some soft reading.  -Continue prescription drug with Lopressor 25 mg twice daily with as needed refills -I have instructed her to stop her HCTZ due to soft BP>>she will let me know if she gets LE edema off  of the diuretic -I have asked her to check her BP twice daily for a week and call with results -I have personally reviewed and interpreted outside labs performed by patient's PCP which showed serum creatinine 1.06 and potassium 4.1 on 10/04/2021   3.  Hyperlipidemia  -LDL goal < 70 -I have personally reviewed and interpreted outside Labs performed by patient's PCP which showed LDL 44 HDL 59 on 10/04/2021 -continue with drug management with Crestor 40 mg daily and Zetia 10 mg daily with as needed refills  4.  Dilated ascending aorta  -noted on echo 03/2019 measuring 59m -repeat echo 03/2020 469m-Repeat echo 03/2022 -BP is well controlled at home  5.  Arrhythmia -she says that sometimes her BP cuff will read irregular rhythm -I will get a 30 day event monitor to rule out afib   Medication Adjustments/Labs and Tests Ordered: Current medicines are reviewed at length with the patient today.  Concerns regarding medicines are outlined above.  Medication changes, Labs and Tests ordered today are listed in the Patient Instructions below. There are no Patient Instructions on file for this visit.   Signed, TrFransico HimMD  03/03/2022 11:54 AM    [facility] [address] [place]  [id number]: [phone]: [phone]

## 2022-03-03 NOTE — Addendum Note (Signed)
Addended by: Bernestine Amass on: 03/03/2022 12:11 PM   Modules accepted: Orders

## 2022-03-09 ENCOUNTER — Ambulatory Visit: Payer: Medicare Other | Attending: Cardiology

## 2022-03-09 DIAGNOSIS — R002 Palpitations: Secondary | ICD-10-CM | POA: Diagnosis not present

## 2022-03-09 DIAGNOSIS — I25118 Atherosclerotic heart disease of native coronary artery with other forms of angina pectoris: Secondary | ICD-10-CM

## 2022-03-09 DIAGNOSIS — E7800 Pure hypercholesterolemia, unspecified: Secondary | ICD-10-CM

## 2022-03-09 DIAGNOSIS — I10 Essential (primary) hypertension: Secondary | ICD-10-CM

## 2022-03-09 DIAGNOSIS — I77810 Thoracic aortic ectasia: Secondary | ICD-10-CM

## 2022-03-10 ENCOUNTER — Other Ambulatory Visit: Payer: Self-pay | Admitting: Cardiology

## 2022-03-10 ENCOUNTER — Telehealth: Payer: Self-pay | Admitting: Cardiology

## 2022-03-10 NOTE — Telephone Encounter (Signed)
Patient called in with bp readings 11/11: 116/70 am            113/74 pm  11/12: 122/70 am            113/70 pm  11/13: 104/71 am            109/70 pm  11/14: 112/69 am            122/64 pm  11/15: 122/75 am            103/66 pm  11/16: 117/73 am            122/72 pm  11/17: 122/74 am            104/63 pm

## 2022-03-10 NOTE — Telephone Encounter (Signed)
Spoke with patient to let her know Dr Radford Pax said BP looks good. Patient denies dizziness and states she started cardiac monitor yesterday. No further concerns at this time.

## 2022-03-30 ENCOUNTER — Ambulatory Visit (HOSPITAL_COMMUNITY): Payer: Medicare Other | Attending: Cardiology

## 2022-03-30 ENCOUNTER — Telehealth: Payer: Self-pay | Admitting: Cardiology

## 2022-03-30 ENCOUNTER — Encounter: Payer: Self-pay | Admitting: Cardiology

## 2022-03-30 DIAGNOSIS — R002 Palpitations: Secondary | ICD-10-CM | POA: Insufficient documentation

## 2022-03-30 DIAGNOSIS — I10 Essential (primary) hypertension: Secondary | ICD-10-CM | POA: Diagnosis not present

## 2022-03-30 DIAGNOSIS — I25118 Atherosclerotic heart disease of native coronary artery with other forms of angina pectoris: Secondary | ICD-10-CM | POA: Insufficient documentation

## 2022-03-30 DIAGNOSIS — I77810 Thoracic aortic ectasia: Secondary | ICD-10-CM | POA: Insufficient documentation

## 2022-03-30 DIAGNOSIS — E7800 Pure hypercholesterolemia, unspecified: Secondary | ICD-10-CM | POA: Insufficient documentation

## 2022-03-30 LAB — ECHOCARDIOGRAM COMPLETE
Area-P 1/2: 3.34 cm2
MV M vel: 3.3 m/s
MV Peak grad: 43.6 mmHg
S' Lateral: 2.1 cm

## 2022-03-30 NOTE — Telephone Encounter (Signed)
Patient stated that Dr. Radford Pax took her off of a prescribed Diuretic. However the Patient has requested that they be put back on the diuretic due to current symptoms. Patient stated that she was taking the prescription for 2 weeks before being told to discontinue usage. The patient does still have medicine remaining from her previous prescription.

## 2022-03-30 NOTE — Telephone Encounter (Signed)
Left message for return call.

## 2022-04-05 MED ORDER — HYDROCHLOROTHIAZIDE 12.5 MG PO CAPS: 12.5000 mg | ORAL_CAPSULE | Freq: Every day | ORAL | 3 refills | Status: DC | PRN

## 2022-04-05 NOTE — Telephone Encounter (Signed)
Informed patient that she could take HCTZ as needed daily for edema.

## 2022-04-05 NOTE — Telephone Encounter (Signed)
Pt returning call If she doesn't answer home - Cell [phone]

## 2022-04-07 DIAGNOSIS — Z1331 Encounter for screening for depression: Secondary | ICD-10-CM | POA: Diagnosis not present

## 2022-04-07 DIAGNOSIS — J45909 Unspecified asthma, uncomplicated: Secondary | ICD-10-CM | POA: Diagnosis not present

## 2022-04-07 DIAGNOSIS — I25119 Atherosclerotic heart disease of native coronary artery with unspecified angina pectoris: Secondary | ICD-10-CM | POA: Diagnosis not present

## 2022-04-07 DIAGNOSIS — Z0000 Encounter for general adult medical examination without abnormal findings: Secondary | ICD-10-CM | POA: Diagnosis not present

## 2022-04-07 DIAGNOSIS — I5030 Unspecified diastolic (congestive) heart failure: Secondary | ICD-10-CM | POA: Diagnosis not present

## 2022-04-07 DIAGNOSIS — I10 Essential (primary) hypertension: Secondary | ICD-10-CM | POA: Diagnosis not present

## 2022-04-07 DIAGNOSIS — K219 Gastro-esophageal reflux disease without esophagitis: Secondary | ICD-10-CM | POA: Diagnosis not present

## 2022-04-07 DIAGNOSIS — I209 Angina pectoris, unspecified: Secondary | ICD-10-CM | POA: Diagnosis not present

## 2022-04-07 DIAGNOSIS — G2581 Restless legs syndrome: Secondary | ICD-10-CM | POA: Diagnosis not present

## 2022-04-07 DIAGNOSIS — E7800 Pure hypercholesterolemia, unspecified: Secondary | ICD-10-CM | POA: Diagnosis not present

## 2022-04-07 DIAGNOSIS — R7303 Prediabetes: Secondary | ICD-10-CM | POA: Diagnosis not present

## 2022-04-15 ENCOUNTER — Other Ambulatory Visit: Payer: Self-pay | Admitting: Cardiology

## 2022-04-19 ENCOUNTER — Other Ambulatory Visit: Payer: Self-pay | Admitting: Cardiology

## 2022-04-20 ENCOUNTER — Other Ambulatory Visit: Payer: Self-pay | Admitting: Cardiology

## 2022-06-27 ENCOUNTER — Other Ambulatory Visit: Payer: Self-pay | Admitting: Family Medicine

## 2022-06-27 DIAGNOSIS — Z1231 Encounter for screening mammogram for malignant neoplasm of breast: Secondary | ICD-10-CM

## 2022-07-15 ENCOUNTER — Other Ambulatory Visit: Payer: Self-pay | Admitting: Cardiology

## 2022-08-10 ENCOUNTER — Ambulatory Visit
Admission: RE | Admit: 2022-08-10 | Discharge: 2022-08-10 | Disposition: A | Discharge status: Home / Self Care | Payer: Medicare Other | Source: Ambulatory Visit | Attending: Family Medicine | Admitting: Family Medicine

## 2022-08-10 DIAGNOSIS — Z1231 Encounter for screening mammogram for malignant neoplasm of breast: Secondary | ICD-10-CM

## 2022-10-06 DIAGNOSIS — G2581 Restless legs syndrome: Secondary | ICD-10-CM | POA: Diagnosis not present

## 2022-10-06 DIAGNOSIS — E7800 Pure hypercholesterolemia, unspecified: Secondary | ICD-10-CM | POA: Diagnosis not present

## 2022-10-06 DIAGNOSIS — I5030 Unspecified diastolic (congestive) heart failure: Secondary | ICD-10-CM | POA: Diagnosis not present

## 2022-10-06 DIAGNOSIS — I10 Essential (primary) hypertension: Secondary | ICD-10-CM | POA: Diagnosis not present

## 2022-10-06 DIAGNOSIS — R7303 Prediabetes: Secondary | ICD-10-CM | POA: Diagnosis not present

## 2022-10-06 DIAGNOSIS — K219 Gastro-esophageal reflux disease without esophagitis: Secondary | ICD-10-CM | POA: Diagnosis not present

## 2022-10-06 DIAGNOSIS — I209 Angina pectoris, unspecified: Secondary | ICD-10-CM | POA: Diagnosis not present

## 2022-10-06 DIAGNOSIS — J45909 Unspecified asthma, uncomplicated: Secondary | ICD-10-CM | POA: Diagnosis not present

## 2022-10-06 DIAGNOSIS — I25119 Atherosclerotic heart disease of native coronary artery with unspecified angina pectoris: Secondary | ICD-10-CM | POA: Diagnosis not present

## 2022-10-06 LAB — LAB REPORT - SCANNED
A1c: 6
EGFR: 56

## 2022-11-30 DIAGNOSIS — H5051 Esophoria: Secondary | ICD-10-CM | POA: Diagnosis not present

## 2022-11-30 DIAGNOSIS — H52223 Regular astigmatism, bilateral: Secondary | ICD-10-CM | POA: Diagnosis not present

## 2022-11-30 DIAGNOSIS — H00025 Hordeolum internum left lower eyelid: Secondary | ICD-10-CM | POA: Diagnosis not present

## 2022-11-30 DIAGNOSIS — H00022 Hordeolum internum right lower eyelid: Secondary | ICD-10-CM | POA: Diagnosis not present

## 2022-11-30 DIAGNOSIS — H524 Presbyopia: Secondary | ICD-10-CM | POA: Diagnosis not present

## 2022-11-30 DIAGNOSIS — H40002 Preglaucoma, unspecified, left eye: Secondary | ICD-10-CM | POA: Diagnosis not present

## 2022-12-08 ENCOUNTER — Telehealth: Payer: Self-pay | Admitting: Cardiology

## 2022-12-08 NOTE — Telephone Encounter (Signed)
Patient wants a call back to know if she will need to get orders for lab work prior to her visit on 11/12.

## 2022-12-08 NOTE — Telephone Encounter (Signed)
Patient would like to know if she needs to come to appt on 03/06/23 fasting for possible labs.  Last Lipid panel drawn in June 2024 at [facility] office (scanned results in Epic), along with a CMP and Hgb A1c.  Will forward to Jacolyn Reedy, PA-C to review chart and advise on whether or not patient needs to come to appt fasting.

## 2022-12-11 NOTE — Telephone Encounter (Signed)
Spoke with patient and shared response from Jacolyn Reedy, PA-C:  She doesn't have to come fasting if she just had lipid panel in June    Patient verbalized understanding and expressed appreciation for follow-up.

## 2023-01-29 DIAGNOSIS — H524 Presbyopia: Secondary | ICD-10-CM | POA: Diagnosis not present

## 2023-01-29 DIAGNOSIS — H5051 Esophoria: Secondary | ICD-10-CM | POA: Diagnosis not present

## 2023-01-29 DIAGNOSIS — H401122 Primary open-angle glaucoma, left eye, moderate stage: Secondary | ICD-10-CM | POA: Diagnosis not present

## 2023-01-29 DIAGNOSIS — H401111 Primary open-angle glaucoma, right eye, mild stage: Secondary | ICD-10-CM | POA: Diagnosis not present

## 2023-01-29 DIAGNOSIS — H52223 Regular astigmatism, bilateral: Secondary | ICD-10-CM | POA: Diagnosis not present

## 2023-02-09 DIAGNOSIS — Z23 Encounter for immunization: Secondary | ICD-10-CM | POA: Diagnosis not present

## 2023-02-20 NOTE — Progress Notes (Signed)
Cardiology Office Note:  .   Date:  03/06/2023  ID:  Connie Weber, DOB 26-Jul-1940, MRN [id number] PCP: Blair Heys, MD (Inactive)  [facility] Providers Cardiologist:  Armanda Magic, MD    History of Present Illness: .   Connie Weber is a 82 y.o. female   with a hx of HTN, HLD, asthma, GERD and CKD stage III, ascending aortic aneurysm measuring 40 mm and CAD with Coronary CTA 2021 showing a coronary calcium score of 298 with  moderate atherosclerosis of the ostial RCA, proximal to mid LAD and proximal LCx with normal coronary origin with right dominance.  FFR analysis demonstrated possibly flow-limiting lesion in the diagonal branch and mid to distal LAD with recommendations for aggressive medical management and if continued chest discomfort then consider cardiac catheterization.    Patient continued to have chest pain dyspnea on exertion despite increase in long-acting nitrates and  underwent left heart cath 08/22/2019 75-80% ostial first diagonal, 50% proximal to mid LAD with FFR of the LAD 0.93 recommended medical therapy for diagonal disease because PCI of the diagonal would involve the LAD and possible lead to stenting.  Recommended intensify treatment for diastolic heart failure and angina.   Patient saw Dr. [name] 02/2022 and BP cuff read irregular rhythm so 30 day monitor ordered-NSR no arrhythmias.    Patient says she has an occasional stabbing chest pain with activity that goes away when she stops.She's been doing more so having some more symptoms than before. She has some DOE trying to clean her house to move into senior living. She has lost her husband and 2 son's(twins) in the past 2 yrs. BP up today, is getting extra salt in canned soups and frozen dinners.   ROS:    Studies Reviewed: Marland Kitchen    EKG Interpretation Date/Time:  Tuesday March 06 2023 11:19:06 EST Ventricular Rate:  62 PR Interval:  180 QRS Duration:  108 QT Interval:  448 QTC Calculation: 454 R  Axis:   67  Text Interpretation: Normal sinus rhythm Normal ECG When compared with ECG of 22-Aug-2019 12:02, No significant change was found Confirmed by Jacolyn Reedy [id number]) on 03/06/2023 11:20:38 AM    Prior CV Studies:   Monitor 03/2022 Predominant rhythm was normal sinus rhythm with an average heart rate of 67 bpm and ranged from 44 to 111 bpm   No arrhythmias noted  Echo 03/2022 IMPRESSIONS     1. Left ventricular ejection fraction, by estimation, is 60 to 65%. The  left ventricle has normal function. The left ventricle has no regional  wall motion abnormalities. There is mild asymmetric left ventricular  hypertrophy of the basal-septal segment.  Left ventricular diastolic parameters are indeterminate.   2. Right ventricular systolic function is normal. The right ventricular  size is normal.   3. The mitral valve is normal in structure. Trivial mitral valve  regurgitation. No evidence of mitral stenosis.   4. The aortic valve is tricuspid. Aortic valve regurgitation is not  visualized. Aortic valve sclerosis/calcification is present, without any  evidence of aortic stenosis.   5. Aortic dilatation noted. There is dilatation of the ascending aorta,  measuring 40 mm.       Cardiac catheterization 4/30/2021Left heart cath, coronary angiography, and hemodynamic recordings via right radial using real-time vascular ultrasound for arterial access Significant pain in the forearm and brachial related to catheter size relative to arteries. Left main is widely patent. Segmental 50% proximal to mid LAD narrowing.  The first  diagonal arises in the middle of the segmental LAD stenosis and contains 75 to 80% stenosis ostially and into the proximal segment. Circumflex is large with no significant obstructive lesion. Right coronary is dominant with no significant obstructive lesion. RFR: LAD 0.93.  Significant is less than 0.89.  Did not assess the diagonal as it is likely hemodynamically  significant.  Recommend aggressive medical therapy for diagonal disease.  I shot away from PCI all the diagonal because PCI would involve the LAD and possibility lead stenting.  Recommendation: Intensify therapy for diastolic heart failure and angina.   Echo 03/2019  IMPRESSIONS     1. Left ventricular ejection fraction, by visual estimation, is 65 to  70%. The left ventricle has hyperdynamic function. There is mildly  increased left ventricular hypertrophy.   2. Left ventricular diastolic parameters are consistent with Grade I  diastolic dysfunction (impaired relaxation).   3. The left ventricle has no regional wall motion abnormalities.   4. Global right ventricle has normal systolic function.The right  ventricular size is normal. No increase in right ventricular wall  thickness.   5. Left atrial size was normal.   6. Right atrial size was normal.   7. The mitral valve is normal in structure. Trivial mitral valve  regurgitation. No evidence of mitral stenosis.   8. The tricuspid valve is normal in structure. Tricuspid valve  regurgitation is not demonstrated.   9. The aortic valve is normal in structure. Aortic valve regurgitation is  not visualized. Mild aortic valve sclerosis without stenosis.  10. The pulmonic valve was normal in structure. Pulmonic valve  regurgitation is not visualized.  11. Aneurysm of the ascending aorta, measuring 40 mm.  12. The inferior vena cava is normal in size with greater than 50%  respiratory variability, suggesting right atrial pressure of 3 mmHg.   FINDINGS   Left Ventricle: Left ventricular ejection fraction, by visual estimation,  is 65 to 70%. The left ventricle has hyperdynamic function. The left  ventricle has no regional wall motion abnormalities. There is mildly  increased left ventricular hypertrophy.  Left ventricular diastolic parameters are consistent with Grade I  diastolic dysfunction (impaired relaxation). Normal left atrial  pressure.   Right Ventricle: The right ventricular size is normal. No increase in  right ventricular wall thickness. Global RV systolic function is has  normal systolic function.   Left Atrium: Left atrial size was normal in size.   Right Atrium: Right atrial size was normal in size   Pericardium: There is no evidence of pericardial effusion.   Mitral Valve: The mitral valve is normal in structure. Trivial mitral  valve regurgitation. No evidence of mitral valve stenosis by observation.   Tricuspid Valve: The tricuspid valve is normal in structure. Tricuspid  valve regurgitation is not demonstrated.   Aortic Valve: The aortic valve is normal in structure. Aortic valve  regurgitation is not visualized. Mild aortic valve sclerosis is present,  with no evidence of aortic valve stenosis.   Pulmonic Valve: The pulmonic valve was normal in structure. Pulmonic valve  regurgitation is not visualized. Pulmonic regurgitation is not visualized.   Aorta: The aortic root, ascending aorta and aortic arch are all  structurally normal, with no evidence of dilitation or obstruction. There  is an aneurysm involving the ascending aorta. The aneurysm measures 40 mm.   Venous: The inferior vena cava is normal in size with greater than 50%  respiratory variability, suggesting right atrial pressure of 3 mmHg.   IAS/Shunts: No  atrial level shunt detected by color flow Doppler. There is  no evidence of a patent foramen ovale. No ventricular septal defect is  seen or detected. There is no evidence of an atrial septal defect.     Risk Assessment/Calculations:     HYPERTENSION CONTROL Vitals:   03/06/23 1115 03/06/23 1133  BP: (!) 142/84 (!) 160/100    The patient's blood pressure is elevated above target today.  In order to address the patient's elevated BP: Blood pressure will be monitored at home to determine if medication changes need to be made.; The blood pressure is usually elevated in  clinic.  Blood pressures monitored at home have been optimal.; Follow up with primary care provider for management.          Physical Exam:   VS:  BP (!) 160/100   Pulse 62   Ht 5\' 2"  (1.575 m)   Wt 176 lb 12.8 oz (80.2 kg)   SpO2 96%   BMI 32.34 kg/m    Wt Readings from Last 3 Encounters:  03/06/23 176 lb 12.8 oz (80.2 kg)  03/03/22 172 lb (78 kg)  01/31/21 175 lb 6.4 oz (79.6 kg)    GEN: Well nourished, well developed in no acute distress NECK: No JVD; No carotid bruits CARDIAC:  RRR, no murmurs, rubs, gallops RESPIRATORY:  Clear to auscultation without rales, wheezing or rhonchi  ABDOMEN: Soft, non-tender, non-distended EXTREMITIES:  No edema; No deformity   ASSESSMENT AND PLAN: .   CAD with coronary CTA calcium score 298 with moderate atherosclerosis of the ostial RCA proximal to mid LAD and proximal circumflex, FFR analysis demonstrated possible flow-limiting lesion in the diagonal branch in the mid to distal LAD.  Cardiac cath 30/2150% proximal to mid LAD and 75% to 80% diagonal 1 medical therapy recommended.  -continue Imdur, metoprolol, ranexa, zetia, crestor, ASA -chronic stable angina -will need cardiologist in [place] when she moves to senior living.   Essential hypertension BP running high while talking about the loss of her husband and twin sons in the past 2 yrs. Better controlled at home 120's systolic. Getting extra salt. 2 gm sodium diet discussed and she'll monitor at home and call if elevated. She has white coat syndrome.   Hyperlipidemia on crestor 40 mg daily and zetia. LDL 51 09/2022  Dilated ascending aorta 40 mm on echo 03/2022-will order repeat            Dispo: f/u in 1 yr.  Signed, Jacolyn Reedy, PA-C

## 2023-03-03 ENCOUNTER — Other Ambulatory Visit: Payer: Self-pay | Admitting: Cardiology

## 2023-03-06 ENCOUNTER — Ambulatory Visit: Payer: Medicare Other | Attending: Physician Assistant | Admitting: Physician Assistant

## 2023-03-06 ENCOUNTER — Encounter: Payer: Self-pay | Admitting: Physician Assistant

## 2023-03-06 VITALS — BP 160/100 | HR 62 | Ht 62.0 in | Wt 176.8 lb

## 2023-03-06 DIAGNOSIS — I25118 Atherosclerotic heart disease of native coronary artery with other forms of angina pectoris: Secondary | ICD-10-CM | POA: Insufficient documentation

## 2023-03-06 DIAGNOSIS — I77810 Thoracic aortic ectasia: Secondary | ICD-10-CM | POA: Diagnosis not present

## 2023-03-06 DIAGNOSIS — E7849 Other hyperlipidemia: Secondary | ICD-10-CM | POA: Diagnosis not present

## 2023-03-06 DIAGNOSIS — I10 Essential (primary) hypertension: Secondary | ICD-10-CM | POA: Diagnosis not present

## 2023-03-06 NOTE — Addendum Note (Signed)
Addended by: Franchot Gallo on: 03/06/2023 12:23 PM   Modules accepted: Orders

## 2023-03-06 NOTE — Patient Instructions (Signed)
Medication Instructions:  Your physician recommends that you continue on your current medications as directed. Please refer to the Current Medication list given to you today.  *If you need a refill on your cardiac medications before your next appointment, please call your pharmacy*  Lab Work: TODAY: CMET, CBC If you have labs (blood work) drawn today and your tests are completely normal, you will receive your results only by: MyChart Message (if you have MyChart) OR A paper copy in the mail If you have any lab test that is abnormal or we need to change your treatment, we will call you to review the results.  Testing/Procedures: Your physician has requested that you have an echocardiogram. Echocardiography is a painless test that uses sound waves to create images of your heart. It provides your doctor with information about the size and shape of your heart and how well your heart's chambers and valves are working. This procedure takes approximately one hour. There are no restrictions for this procedure. Please do NOT wear cologne, perfume, aftershave, or lotions (deodorant is allowed). Please arrive 15 minutes prior to your appointment time.  Please note: We ask at that you not bring children with you during ultrasound (echo/ vascular) testing. Due to room size and safety concerns, children are not allowed in the ultrasound rooms during exams. Our front office staff cannot provide observation of children in our lobby area while testing is being conducted. An adult accompanying a patient to their appointment will only be allowed in the ultrasound room at the discretion of the ultrasound technician under special circumstances. We apologize for any inconvenience.  Follow-Up: At [facility], you and your health needs are our priority.  As part of our continuing mission to provide you with exceptional heart care, we have created designated Provider Care Teams.  These Care Teams include your primary  Cardiologist (physician) and Advanced Practice Providers (APPs -  Physician Assistants and Nurse Practitioners) who all work together to provide you with the care you need, when you need it.  Your next appointment:   1 year(s)  The format for your next appointment:   In Person  Provider:   Armanda Magic, MD

## 2023-03-07 LAB — CBC
Hematocrit: 39.5 % (ref 34.0–46.6)
Hemoglobin: 12.8 g/dL (ref 11.1–15.9)
MCH: 31.7 pg (ref 26.6–33.0)
MCHC: 32.4 g/dL (ref 31.5–35.7)
MCV: 98 fL — ABNORMAL HIGH (ref 79–97)
Platelets: 243 10*3/uL (ref 150–450)
RBC: 4.04 x10E6/uL (ref 3.77–5.28)
RDW: 13 % (ref 11.7–15.4)
WBC: 7 10*3/uL (ref 3.4–10.8)

## 2023-03-07 LAB — COMPREHENSIVE METABOLIC PANEL
ALT: 23 [IU]/L (ref 0–32)
AST: 25 [IU]/L (ref 0–40)
Albumin: 4.3 g/dL (ref 3.7–4.7)
Alkaline Phosphatase: 59 [IU]/L (ref 44–121)
BUN/Creatinine Ratio: 15 (ref 12–28)
BUN: 14 mg/dL (ref 8–27)
Bilirubin Total: 0.5 mg/dL (ref 0.0–1.2)
CO2: 26 mmol/L (ref 20–29)
Calcium: 9.5 mg/dL (ref 8.7–10.3)
Chloride: 107 mmol/L — ABNORMAL HIGH (ref 96–106)
Creatinine, Ser: 0.96 mg/dL (ref 0.57–1.00)
Globulin, Total: 2.3 g/dL (ref 1.5–4.5)
Glucose: 108 mg/dL — ABNORMAL HIGH (ref 70–99)
Potassium: 5 mmol/L (ref 3.5–5.2)
Sodium: 143 mmol/L (ref 134–144)
Total Protein: 6.6 g/dL (ref 6.0–8.5)
eGFR: 59 mL/min/{1.73_m2} — ABNORMAL LOW (ref >59)

## 2023-03-17 ENCOUNTER — Other Ambulatory Visit: Payer: Self-pay | Admitting: Cardiology

## 2023-03-31 ENCOUNTER — Other Ambulatory Visit: Payer: Self-pay | Admitting: Cardiology

## 2023-04-03 DIAGNOSIS — H401111 Primary open-angle glaucoma, right eye, mild stage: Secondary | ICD-10-CM | POA: Diagnosis not present

## 2023-04-03 DIAGNOSIS — H5051 Esophoria: Secondary | ICD-10-CM | POA: Diagnosis not present

## 2023-04-03 DIAGNOSIS — H52223 Regular astigmatism, bilateral: Secondary | ICD-10-CM | POA: Diagnosis not present

## 2023-04-03 DIAGNOSIS — H401122 Primary open-angle glaucoma, left eye, moderate stage: Secondary | ICD-10-CM | POA: Diagnosis not present

## 2023-04-03 DIAGNOSIS — H524 Presbyopia: Secondary | ICD-10-CM | POA: Diagnosis not present

## 2023-04-09 ENCOUNTER — Ambulatory Visit (HOSPITAL_COMMUNITY): Payer: Medicare Other | Attending: Cardiology

## 2023-04-09 ENCOUNTER — Telehealth: Payer: Self-pay | Admitting: *Deleted

## 2023-04-09 DIAGNOSIS — I25118 Atherosclerotic heart disease of native coronary artery with other forms of angina pectoris: Secondary | ICD-10-CM

## 2023-04-09 DIAGNOSIS — I77810 Thoracic aortic ectasia: Secondary | ICD-10-CM

## 2023-04-09 DIAGNOSIS — I10 Essential (primary) hypertension: Secondary | ICD-10-CM | POA: Diagnosis not present

## 2023-04-09 LAB — ECHOCARDIOGRAM COMPLETE
Area-P 1/2: 3.21 cm2
S' Lateral: 2.2 cm

## 2023-04-09 NOTE — Telephone Encounter (Signed)
The patient has been notified of the result and verbalized understanding.  All questions (if any) were answered.  Pt education provided about good BP control, taking her cardiac medications, and watch her salt intake.  Advised her to wear compression stockings during the day.  Pt aware that we will order for her to get a repeat echo done in one year for surveillance.   Pt states she is going to keep a weeks worth of her BP readings and send/call them to Jacolyn Reedy PA-C/Dr. [name] to review and advise on as needed.  She states she has been under a lot of stress with losing her husband and Son in the last 2 years, and currently moving to a retirement community in [place].  She wants confirmation from Dr. [name] or Jacolyn Reedy PA-C that her numbers are at goal, with all of this going on.  Pt is aware that I will send this message to Dr. Norris Cross RN to make her aware that she will be sending her some readings in about a week, so that she can have Dr. [name] review those.   Pt aware that I will go ahead and place the one year echo in the system and have our echo scheduler reach out to her closer to that time to arrange that appt.   Pt verbalized understanding and agrees with this plan.

## 2023-04-09 NOTE — Telephone Encounter (Signed)
-----   Message from Jacolyn Reedy sent at 04/09/2023 10:47 AM EST ----- Echo shows normal heart function, some trouble relaxing and mild increase in size of ascending aorta dilatation. Good BP control important. Will continue to monitor yearly. Thanks

## 2023-04-20 ENCOUNTER — Telehealth: Payer: Self-pay | Admitting: Cardiology

## 2023-04-20 ENCOUNTER — Encounter: Payer: Self-pay | Admitting: Physician Assistant

## 2023-04-20 NOTE — Telephone Encounter (Signed)
Pt was told to call back with BP readings. Pt states all PM readings were taking after medication except for today.   12/16: 160/90 AM            135/74 PM  12/17: 132/79 AM             147/80 PM  12/18: 120/82 PM   12/19: 122/82 PM   12/20: 143/80 AM  12/21: 119/74 PM  12/22: 118/82 PM .  12/23:151/54 AM           134/79 PM  12/24: 138/90 AM             136/79 PM  12/25: 141/84 AM            129/75 PM   12/26: 106/71 PM   12/27: 171/85 AM             132/83 PM

## 2023-04-20 NOTE — Telephone Encounter (Signed)
Error

## 2023-04-20 NOTE — Telephone Encounter (Signed)
Call to patient to advise that Dr. [name] reviewed BP log and feels they are acceptable. Dr. [name] recommends patient continue on her currently medications. Patient verbalizes understanding.

## 2023-04-20 NOTE — Telephone Encounter (Signed)
See telephone 04/20/23.

## 2023-04-26 ENCOUNTER — Other Ambulatory Visit: Payer: Self-pay | Admitting: Cardiology

## 2023-05-09 DIAGNOSIS — I10 Essential (primary) hypertension: Secondary | ICD-10-CM | POA: Diagnosis not present

## 2023-05-09 DIAGNOSIS — Z23 Encounter for immunization: Secondary | ICD-10-CM | POA: Diagnosis not present

## 2023-05-09 DIAGNOSIS — R7303 Prediabetes: Secondary | ICD-10-CM | POA: Diagnosis not present

## 2023-05-09 DIAGNOSIS — Z0000 Encounter for general adult medical examination without abnormal findings: Secondary | ICD-10-CM | POA: Diagnosis not present

## 2023-05-09 DIAGNOSIS — I25119 Atherosclerotic heart disease of native coronary artery with unspecified angina pectoris: Secondary | ICD-10-CM | POA: Diagnosis not present

## 2023-05-09 DIAGNOSIS — I77810 Thoracic aortic ectasia: Secondary | ICD-10-CM | POA: Diagnosis not present

## 2023-05-09 DIAGNOSIS — E7800 Pure hypercholesterolemia, unspecified: Secondary | ICD-10-CM | POA: Diagnosis not present

## 2023-05-09 DIAGNOSIS — K219 Gastro-esophageal reflux disease without esophagitis: Secondary | ICD-10-CM | POA: Diagnosis not present

## 2023-05-09 DIAGNOSIS — J45909 Unspecified asthma, uncomplicated: Secondary | ICD-10-CM | POA: Diagnosis not present

## 2023-05-09 DIAGNOSIS — G2581 Restless legs syndrome: Secondary | ICD-10-CM | POA: Diagnosis not present

## 2023-05-09 DIAGNOSIS — I5030 Unspecified diastolic (congestive) heart failure: Secondary | ICD-10-CM | POA: Diagnosis not present

## 2023-05-10 ENCOUNTER — Other Ambulatory Visit: Payer: Self-pay | Admitting: Family Medicine

## 2023-05-10 DIAGNOSIS — M8589 Other specified disorders of bone density and structure, multiple sites: Secondary | ICD-10-CM

## 2023-05-25 ENCOUNTER — Other Ambulatory Visit: Payer: Self-pay | Admitting: Cardiology

## 2023-11-06 DIAGNOSIS — G2581 Restless legs syndrome: Secondary | ICD-10-CM | POA: Diagnosis not present

## 2023-11-06 DIAGNOSIS — N183 Chronic kidney disease, stage 3 unspecified: Secondary | ICD-10-CM | POA: Diagnosis not present

## 2023-11-06 DIAGNOSIS — R7303 Prediabetes: Secondary | ICD-10-CM | POA: Diagnosis not present

## 2023-11-06 DIAGNOSIS — J45909 Unspecified asthma, uncomplicated: Secondary | ICD-10-CM | POA: Diagnosis not present

## 2023-11-06 DIAGNOSIS — M8588 Other specified disorders of bone density and structure, other site: Secondary | ICD-10-CM | POA: Diagnosis not present

## 2023-11-06 DIAGNOSIS — M899 Disorder of bone, unspecified: Secondary | ICD-10-CM | POA: Diagnosis not present

## 2023-11-06 DIAGNOSIS — I25119 Atherosclerotic heart disease of native coronary artery with unspecified angina pectoris: Secondary | ICD-10-CM | POA: Diagnosis not present

## 2023-11-06 DIAGNOSIS — K219 Gastro-esophageal reflux disease without esophagitis: Secondary | ICD-10-CM | POA: Diagnosis not present

## 2023-11-06 DIAGNOSIS — I10 Essential (primary) hypertension: Secondary | ICD-10-CM | POA: Diagnosis not present

## 2023-11-06 DIAGNOSIS — I77810 Thoracic aortic ectasia: Secondary | ICD-10-CM | POA: Diagnosis not present

## 2023-11-06 DIAGNOSIS — E7800 Pure hypercholesterolemia, unspecified: Secondary | ICD-10-CM | POA: Diagnosis not present

## 2023-11-07 ENCOUNTER — Encounter: Payer: Self-pay | Admitting: Emergency Medicine

## 2023-11-07 LAB — LAB REPORT - SCANNED
A1c: 6
Creatinine, POC: 77 mg/dL
EGFR: 64
Microalb Creat Ratio: 16.8
Microalbumin, Urine: 1.29

## 2023-12-06 DIAGNOSIS — H5051 Esophoria: Secondary | ICD-10-CM | POA: Diagnosis not present

## 2023-12-06 DIAGNOSIS — H401122 Primary open-angle glaucoma, left eye, moderate stage: Secondary | ICD-10-CM | POA: Diagnosis not present

## 2023-12-06 DIAGNOSIS — H524 Presbyopia: Secondary | ICD-10-CM | POA: Diagnosis not present

## 2023-12-06 DIAGNOSIS — H52223 Regular astigmatism, bilateral: Secondary | ICD-10-CM | POA: Diagnosis not present

## 2023-12-06 DIAGNOSIS — H401111 Primary open-angle glaucoma, right eye, mild stage: Secondary | ICD-10-CM | POA: Diagnosis not present

## 2023-12-11 DIAGNOSIS — R2681 Unsteadiness on feet: Secondary | ICD-10-CM | POA: Diagnosis not present

## 2023-12-11 DIAGNOSIS — B351 Tinea unguium: Secondary | ICD-10-CM | POA: Diagnosis not present

## 2023-12-11 DIAGNOSIS — M79675 Pain in left toe(s): Secondary | ICD-10-CM | POA: Diagnosis not present

## 2023-12-11 DIAGNOSIS — L603 Nail dystrophy: Secondary | ICD-10-CM | POA: Diagnosis not present

## 2023-12-11 DIAGNOSIS — M79674 Pain in right toe(s): Secondary | ICD-10-CM | POA: Diagnosis not present

## 2023-12-11 DIAGNOSIS — L84 Corns and callosities: Secondary | ICD-10-CM | POA: Diagnosis not present

## 2023-12-11 DIAGNOSIS — I509 Heart failure, unspecified: Secondary | ICD-10-CM | POA: Diagnosis not present

## 2023-12-27 ENCOUNTER — Other Ambulatory Visit: Payer: Medicare Other

## 2024-01-07 ENCOUNTER — Telehealth: Payer: Self-pay | Admitting: Cardiology

## 2024-01-07 NOTE — Telephone Encounter (Signed)
 Pt has moved out of the area and needs medical records sent over to new Cardiologist.   Dr. Liza Beaver Main #: [phone] Fax #: [phone]  [address] [place] [address]

## 2024-02-07 DIAGNOSIS — Z23 Encounter for immunization: Secondary | ICD-10-CM | POA: Diagnosis not present

## 2024-02-19 DIAGNOSIS — I25118 Atherosclerotic heart disease of native coronary artery with other forms of angina pectoris: Secondary | ICD-10-CM | POA: Diagnosis not present

## 2024-02-19 DIAGNOSIS — I5032 Chronic diastolic (congestive) heart failure: Secondary | ICD-10-CM | POA: Diagnosis not present

## 2024-02-19 DIAGNOSIS — R001 Bradycardia, unspecified: Secondary | ICD-10-CM | POA: Diagnosis not present

## 2024-02-19 DIAGNOSIS — E785 Hyperlipidemia, unspecified: Secondary | ICD-10-CM | POA: Diagnosis not present

## 2024-02-19 DIAGNOSIS — R0609 Other forms of dyspnea: Secondary | ICD-10-CM | POA: Diagnosis not present

## 2024-02-19 DIAGNOSIS — I10 Essential (primary) hypertension: Secondary | ICD-10-CM | POA: Diagnosis not present

## 2024-02-19 DIAGNOSIS — I77810 Thoracic aortic ectasia: Secondary | ICD-10-CM | POA: Diagnosis not present

## 2024-03-18 DIAGNOSIS — I25118 Atherosclerotic heart disease of native coronary artery with other forms of angina pectoris: Secondary | ICD-10-CM | POA: Diagnosis not present

## 2024-03-18 DIAGNOSIS — N183 Chronic kidney disease, stage 3 unspecified: Secondary | ICD-10-CM | POA: Diagnosis not present

## 2024-03-18 DIAGNOSIS — M8580 Other specified disorders of bone density and structure, unspecified site: Secondary | ICD-10-CM | POA: Diagnosis not present

## 2024-03-18 DIAGNOSIS — H409 Unspecified glaucoma: Secondary | ICD-10-CM | POA: Diagnosis not present

## 2024-03-18 DIAGNOSIS — I5032 Chronic diastolic (congestive) heart failure: Secondary | ICD-10-CM | POA: Diagnosis not present

## 2024-03-18 DIAGNOSIS — N958 Other specified menopausal and perimenopausal disorders: Secondary | ICD-10-CM | POA: Diagnosis not present

## 2024-03-18 DIAGNOSIS — R0609 Other forms of dyspnea: Secondary | ICD-10-CM | POA: Diagnosis not present

## 2024-03-27 DIAGNOSIS — R0609 Other forms of dyspnea: Secondary | ICD-10-CM | POA: Diagnosis not present

## 2024-03-27 DIAGNOSIS — I358 Other nonrheumatic aortic valve disorders: Secondary | ICD-10-CM | POA: Diagnosis not present

## 2024-03-27 DIAGNOSIS — I5189 Other ill-defined heart diseases: Secondary | ICD-10-CM | POA: Diagnosis not present

## 2024-03-27 DIAGNOSIS — I77810 Thoracic aortic ectasia: Secondary | ICD-10-CM | POA: Diagnosis not present

## 2024-04-02 ENCOUNTER — Other Ambulatory Visit: Payer: Self-pay | Admitting: Cardiology

## 2024-04-03 DIAGNOSIS — H43812 Vitreous degeneration, left eye: Secondary | ICD-10-CM | POA: Diagnosis not present

## 2024-04-15 ENCOUNTER — Other Ambulatory Visit: Payer: Self-pay | Admitting: Cardiology

## 2024-04-16 ENCOUNTER — Other Ambulatory Visit: Payer: Self-pay | Admitting: Cardiology
